# Patient Record
Sex: Female | Born: 1995 | Hispanic: Yes | Marital: Single | Smoking: Never smoker
Health system: Southern US, Community
[De-identification: ages and names within clinical notes are randomized; demographics above are authoritative.]

## PROBLEM LIST (undated history)

## (undated) ENCOUNTER — Inpatient Hospital Stay (HOSPITAL_COMMUNITY): Payer: Self-pay

## (undated) DIAGNOSIS — F32A Depression, unspecified: Secondary | ICD-10-CM

## (undated) DIAGNOSIS — D649 Anemia, unspecified: Secondary | ICD-10-CM

## (undated) DIAGNOSIS — F329 Major depressive disorder, single episode, unspecified: Secondary | ICD-10-CM

## (undated) DIAGNOSIS — B999 Unspecified infectious disease: Secondary | ICD-10-CM

## (undated) HISTORY — PX: FASCIOTOMY: SHX132

## (undated) HISTORY — PX: CHOLECYSTECTOMY: SHX55

---
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
## 2015-04-24 ENCOUNTER — Emergency Department (HOSPITAL_COMMUNITY)
Admission: EM | Admit: 2015-04-24 | Discharge: 2015-04-24 | Disposition: A | Discharge status: Home / Self Care | Payer: BLUE CROSS/BLUE SHIELD | Attending: Emergency Medicine | Admitting: Emergency Medicine

## 2015-04-24 ENCOUNTER — Emergency Department (HOSPITAL_COMMUNITY): Payer: BLUE CROSS/BLUE SHIELD

## 2015-04-24 ENCOUNTER — Encounter (HOSPITAL_COMMUNITY): Payer: Self-pay | Admitting: Emergency Medicine

## 2015-04-24 DIAGNOSIS — N83202 Unspecified ovarian cyst, left side: Secondary | ICD-10-CM

## 2015-04-24 DIAGNOSIS — Z8659 Personal history of other mental and behavioral disorders: Secondary | ICD-10-CM | POA: Diagnosis not present

## 2015-04-24 DIAGNOSIS — D649 Anemia, unspecified: Secondary | ICD-10-CM | POA: Diagnosis not present

## 2015-04-24 DIAGNOSIS — Z79899 Other long term (current) drug therapy: Secondary | ICD-10-CM | POA: Insufficient documentation

## 2015-04-24 DIAGNOSIS — R1030 Lower abdominal pain, unspecified: Secondary | ICD-10-CM | POA: Diagnosis present

## 2015-04-24 DIAGNOSIS — Z87440 Personal history of urinary (tract) infections: Secondary | ICD-10-CM | POA: Diagnosis not present

## 2015-04-24 DIAGNOSIS — Z91040 Latex allergy status: Secondary | ICD-10-CM | POA: Insufficient documentation

## 2015-04-24 DIAGNOSIS — R109 Unspecified abdominal pain: Secondary | ICD-10-CM

## 2015-04-24 LAB — CBC WITH DIFFERENTIAL/PLATELET
Basophils Absolute: 0 10*3/uL (ref 0.0–0.1)
Basophils Relative: 0 %
Eosinophils Absolute: 0.1 10*3/uL (ref 0.0–0.7)
Eosinophils Relative: 1 %
HCT: 39.6 % (ref 36.0–46.0)
Hemoglobin: 13.4 g/dL (ref 12.0–15.0)
Lymphocytes Relative: 25 %
Lymphs Abs: 2.4 10*3/uL (ref 0.7–4.0)
MCH: 29.5 pg (ref 26.0–34.0)
MCHC: 33.8 g/dL (ref 30.0–36.0)
MCV: 87 fL (ref 78.0–100.0)
Monocytes Absolute: 0.6 10*3/uL (ref 0.1–1.0)
Monocytes Relative: 6 %
Neutro Abs: 6.6 10*3/uL (ref 1.7–7.7)
Neutrophils Relative %: 68 %
Platelets: 205 10*3/uL (ref 150–400)
RBC: 4.55 MIL/uL (ref 3.87–5.11)
RDW: 13.1 % (ref 11.5–15.5)
WBC: 9.7 10*3/uL (ref 4.0–10.5)

## 2015-04-24 LAB — COMPREHENSIVE METABOLIC PANEL
ALT: 35 U/L (ref 14–54)
AST: 26 U/L (ref 15–41)
Albumin: 3.8 g/dL (ref 3.5–5.0)
Alkaline Phosphatase: 92 U/L (ref 38–126)
Anion gap: 8 (ref 5–15)
BUN: 18 mg/dL (ref 6–20)
CO2: 23 mmol/L (ref 22–32)
Calcium: 10.1 mg/dL (ref 8.9–10.3)
Chloride: 105 mmol/L (ref 101–111)
Creatinine, Ser: 0.57 mg/dL (ref 0.44–1.00)
GFR calc Af Amer: >60 mL/min (ref >60)
GFR calc non Af Amer: >60 mL/min (ref >60)
Glucose, Bld: 97 mg/dL (ref 65–99)
Potassium: 4 mmol/L (ref 3.5–5.1)
Sodium: 136 mmol/L (ref 135–145)
Total Bilirubin: 0.5 mg/dL (ref 0.3–1.2)
Total Protein: 8 g/dL (ref 6.5–8.1)

## 2015-04-24 LAB — URINALYSIS, ROUTINE W REFLEX MICROSCOPIC
Bilirubin Urine: NEGATIVE
Glucose, UA: NEGATIVE mg/dL
Hgb urine dipstick: NEGATIVE
Ketones, ur: NEGATIVE mg/dL
Leukocytes, UA: NEGATIVE
Nitrite: NEGATIVE
Protein, ur: NEGATIVE mg/dL
Specific Gravity, Urine: 1.018 (ref 1.005–1.030)
pH: 6.5 (ref 5.0–8.0)

## 2015-04-24 LAB — WET PREP, GENITAL
Clue Cells Wet Prep HPF POC: NONE SEEN
Sperm: NONE SEEN
Trich, Wet Prep: NONE SEEN
Yeast Wet Prep HPF POC: NONE SEEN

## 2015-04-24 LAB — I-STAT BETA HCG BLOOD, ED (MC, WL, AP ONLY): I-stat hCG, quantitative: <5 m[IU]/mL (ref <5)

## 2015-04-24 NOTE — ED Provider Notes (Signed)
CSN: [id number]     Arrival date & time 04/24/15  0720 History   First MD Initiated Contact with Patient 04/24/15 [phone]     Chief Complaint  Patient presents with  . Abdominal Pain     (Consider location/radiation/quality/duration/timing/severity/associated sxs/prior Treatment) HPI   89 y f w PMH previous c-section coming in with lower abdominal pain that started suddenly this morning at 5am.  The abdominal pain is equal bilaterally.  The abdominal pain is sharp with no associated sx.  The abdominal pain was maximal intensity at onset but has been improving.  She has never had abdominal pain like this before.  Past Medical History  Diagnosis Date  . Anemia   . Infection     UTI  . Depression     doing ok now   Past Surgical History  Procedure Laterality Date  . Fasciotomy      bilateral  . Cesarean section N/A 11/28/2014    Procedure: CESAREAN SECTION;  Surgeon: Myna Hidalgo, DO;  Location: [facility];  Service: Obstetrics;  Laterality: N/A;   Family History  Problem Relation Age of Onset  . Alcohol abuse Neg Hx   . Arthritis Neg Hx   . Asthma Neg Hx   . Birth defects Neg Hx   . Cancer Neg Hx   . COPD Neg Hx   . Depression Neg Hx   . Drug abuse Neg Hx   . Early death Neg Hx   . Hearing loss Neg Hx   . Heart disease Neg Hx   . Hyperlipidemia Neg Hx   . Hypertension Neg Hx   . Kidney disease Neg Hx   . Learning disabilities Neg Hx   . Mental illness Neg Hx   . Mental retardation Neg Hx   . Miscarriages / Stillbirths Neg Hx   . Stroke Neg Hx   . Vision loss Neg Hx   . Varicose Veins Neg Hx   . Diabetes Paternal Grandmother   . Diabetes Paternal Grandfather    Social History  Substance Use Topics  . Smoking status: Never Smoker   . Smokeless tobacco: Never Used  . Alcohol Use: No   OB History    Gravida Para Term Preterm AB TAB SAB Ectopic Multiple Living   0 1     Review of Systems  Constitutional: Negative for fever and chills.  HENT: Negative  for nosebleeds.   Eyes: Negative for visual disturbance.  Respiratory: Negative for cough and shortness of breath.   Cardiovascular: Negative for chest pain.  Gastrointestinal: Positive for abdominal pain. Negative for nausea, vomiting, diarrhea and constipation.  Genitourinary: Negative for dysuria.  Skin: Negative for rash.  Neurological: Negative for weakness.  All other systems reviewed and are negative.     Allergies  Latex  Home Medications   Prior to Admission medications   Medication Sig Start Date End Date Taking? Authorizing Provider  ferrous sulfate 325 (65 FE) MG tablet Take 1 tablet (325 mg total) by mouth daily with breakfast. 12/01/14  Yes Nigel Bridgeman, CNM  ibuprofen (ADVIL,MOTRIN) 600 MG tablet Take 1 tablet (600 mg total) by mouth every 6 (six) hours as needed. 12/01/14  Yes Nigel Bridgeman, CNM  Prenatal Vit-Fe Fumarate-FA (PRENATAL MULTIVITAMIN) TABS tablet Take 1 tablet by mouth daily.    Yes Historical Provider, MD  acetaminophen (TYLENOL) 500 MG tablet Take 1,000 mg by mouth every 6 (six) hours as needed for mild pain.  Historical Provider, MD  oxyCODONE-acetaminophen (PERCOCET/ROXICET) 5-325 MG per tablet Take 1 tablet by mouth every 4 (four) hours as needed (for pain scale 4-7). 12/01/14   Nigel Bridgeman, CNM   BP 101/46 mmHg  Pulse 63  Temp(Src) 98.5 F (36.9 C) (Oral)  Resp 17  SpO2 99% Physical Exam  Constitutional: She is oriented to person, place, and time. No distress.  HENT:  Head: Normocephalic and atraumatic.  Eyes: EOM are normal. Pupils are equal, round, and reactive to light.  Neck: Normal range of motion. Neck supple.  Cardiovascular: Normal rate and intact distal pulses.   Pulmonary/Chest: No respiratory distress.  Abdominal: Soft. There is tenderness (bilateral lower quadrant ttp).  Musculoskeletal: Normal range of motion.  Neurological: She is alert and oriented to person, place, and time.  Skin: No rash noted. She is not diaphoretic.   Psychiatric: She has a normal mood and affect.    ED Course  Procedures (including critical care time) Labs Review Labs Reviewed  WET PREP, GENITAL - Abnormal; Notable for the following:    WBC, Wet Prep HPF POC MODERATE (*)    All other components within normal limits  URINE CULTURE  CBC WITH DIFFERENTIAL/PLATELET  COMPREHENSIVE METABOLIC PANEL  URINALYSIS, ROUTINE W REFLEX MICROSCOPIC (NOT AT [facility])  I-STAT BETA HCG BLOOD, ED ([facility], [facility], [facility] ONLY)  GC/CHLAMYDIA PROBE AMP ([place]) NOT AT [facility]  WET PREP  (BD AFFIRM) ([employer])    Imaging Review US Transvaginal Non-ob  04/24/2015  CLINICAL DATA:  Acute pelvic pain.  Last menstrual period 03/12/2015 EXAM: TRANSABDOMINAL AND TRANSVAGINAL ULTRASOUND OF PELVIS DOPPLER ULTRASOUND OF OVARIES TECHNIQUE: Both transabdominal and transvaginal ultrasound examinations of the pelvis were performed. Transabdominal technique was performed for global imaging of the pelvis including uterus, ovaries, adnexal regions, and pelvic cul-de-sac. It was necessary to proceed with endovaginal exam following the transabdominal exam to visualize the IUD. Color and duplex Doppler ultrasound was utilized to evaluate blood flow to the ovaries. COMPARISON:  None. FINDINGS: Uterus Measurements: 8.3 x 4.4 x 5.7 cm. No fibroids or other mass visualized. IUD is in place, in satisfactory position sonographically. Endometrium Thickness: 12.5 mm.  No focal abnormality visualized. Right ovary Measurements: 2.3 x 2.4 x 1.8 cm. Normal appearance/no adnexal mass. Left ovary Measurements: 3.5 x 1.6 x 1.7 cm. There is a 1.6 x 1.0 x 0.8 cm hyperechoic circumscribed structure associated with the left ovary. Pulsed Doppler evaluation of both ovaries demonstrates normal low-resistance arterial and venous waveforms. Other findings Small amount of free fluid IMPRESSION: Mild thickening of the endometrium, somewhat unusual finding in the presence of IUD. 1.6 cm hypoechoic structure associated  with the left ovary, which may represent degenerating hemorrhagic cyst or corpus luteum. Normal appearance of the right ovary. Small amount of free fluid in the pelvis. Electronically Signed   By: Ted Mcalpine M.D.   On: 04/24/2015 10:12   US Pelvis Complete  04/24/2015  CLINICAL DATA:  Acute pelvic pain.  Last menstrual period 03/12/2015 EXAM: TRANSABDOMINAL AND TRANSVAGINAL ULTRASOUND OF PELVIS DOPPLER ULTRASOUND OF OVARIES TECHNIQUE: Both transabdominal and transvaginal ultrasound examinations of the pelvis were performed. Transabdominal technique was performed for global imaging of the pelvis including uterus, ovaries, adnexal regions, and pelvic cul-de-sac. It was necessary to proceed with endovaginal exam following the transabdominal exam to visualize the IUD. Color and duplex Doppler ultrasound was utilized to evaluate blood flow to the ovaries. COMPARISON:  None. FINDINGS: Uterus Measurements: 8.3 x 4.4 x 5.7 cm. No fibroids or other  mass visualized. IUD is in place, in satisfactory position sonographically. Endometrium Thickness: 12.5 mm.  No focal abnormality visualized. Right ovary Measurements: 2.3 x 2.4 x 1.8 cm. Normal appearance/no adnexal mass. Left ovary Measurements: 3.5 x 1.6 x 1.7 cm. There is a 1.6 x 1.0 x 0.8 cm hyperechoic circumscribed structure associated with the left ovary. Pulsed Doppler evaluation of both ovaries demonstrates normal low-resistance arterial and venous waveforms. Other findings Small amount of free fluid IMPRESSION: Mild thickening of the endometrium, somewhat unusual finding in the presence of IUD. 1.6 cm hypoechoic structure associated with the left ovary, which may represent degenerating hemorrhagic cyst or corpus luteum. Normal appearance of the right ovary. Small amount of free fluid in the pelvis. Electronically Signed   By: Ted Mcalpine M.D.   On: 04/24/2015 10:12   [place] Art/ven Flow Abd Pelv Doppler  04/24/2015  CLINICAL DATA:  Acute pelvic pain.   Last menstrual period 03/12/2015 EXAM: TRANSABDOMINAL AND TRANSVAGINAL ULTRASOUND OF PELVIS DOPPLER ULTRASOUND OF OVARIES TECHNIQUE: Both transabdominal and transvaginal ultrasound examinations of the pelvis were performed. Transabdominal technique was performed for global imaging of the pelvis including uterus, ovaries, adnexal regions, and pelvic cul-de-sac. It was necessary to proceed with endovaginal exam following the transabdominal exam to visualize the IUD. Color and duplex Doppler ultrasound was utilized to evaluate blood flow to the ovaries. COMPARISON:  None. FINDINGS: Uterus Measurements: 8.3 x 4.4 x 5.7 cm. No fibroids or other mass visualized. IUD is in place, in satisfactory position sonographically. Endometrium Thickness: 12.5 mm.  No focal abnormality visualized. Right ovary Measurements: 2.3 x 2.4 x 1.8 cm. Normal appearance/no adnexal mass. Left ovary Measurements: 3.5 x 1.6 x 1.7 cm. There is a 1.6 x 1.0 x 0.8 cm hyperechoic circumscribed structure associated with the left ovary. Pulsed Doppler evaluation of both ovaries demonstrates normal low-resistance arterial and venous waveforms. Other findings Small amount of free fluid IMPRESSION: Mild thickening of the endometrium, somewhat unusual finding in the presence of IUD. 1.6 cm hypoechoic structure associated with the left ovary, which may represent degenerating hemorrhagic cyst or corpus luteum. Normal appearance of the right ovary. Small amount of free fluid in the pelvis. Electronically Signed   By: Ted Mcalpine M.D.   On: 04/24/2015 10:12   I have personally reviewed and evaluated these images and lab results as part of my medical decision-making.   EKG Interpretation None      MDM   Final diagnoses:  Cyst of left ovary  Abdominal pain, unspecified abdominal location     18 y f w PMH previous c-section coming in with lower abdominal pain that started suddenly this morning at 5am.   Exam as above.    ttp in the LLQ  and RLQ, no guarding. Ddx:  Ovarian cyst, torsion.  Doubt appy given sudden onset pain and non-localizing exam.  Doubt sbo.  Feel torsion is also less likely given non-localizing exam.  Will obtain cbc/bmp/ua/preg/pelvic US  Cbc/bmp unremarkable.  preg neg.  ua neg.  Wet prep/gc/chl obtained and wet prep neg w/ blind swab, pelvic declined.  Pelvis US with ovarian cyst on L w/o evidence of torsion.   Likely hemorrhagic cyst is the etiology of her pain, her pain has continued to improve, feel safe for d/c.    Silas Flood, MD 04/24/15 1205  Marily Memos, MD 04/24/15 1620

## 2015-04-24 NOTE — ED Notes (Signed)
Per Resident pt to swab self & RN is to facilitate collection of samples to be sent to lab, pt swabbed self & samples collected

## 2015-04-24 NOTE — ED Notes (Signed)
Pelvic cart at bedside. 

## 2015-04-24 NOTE — Discharge Instructions (Signed)

## 2015-04-24 NOTE — ED Notes (Signed)
Pt arrives with lower abdominal pain since 5am. LBM yesterday was normal. Denies nausea/vomting/fever/dysuria. Pt resting in bed, VSS.

## 2015-04-25 LAB — URINE CULTURE

## 2015-04-27 LAB — GC/CHLAMYDIA PROBE AMP (~~LOC~~) NOT AT ARMC
Chlamydia: NEGATIVE
Neisseria Gonorrhea: NEGATIVE

## 2015-08-31 DIAGNOSIS — J020 Streptococcal pharyngitis: Secondary | ICD-10-CM | POA: Diagnosis not present

## 2015-09-17 DIAGNOSIS — J029 Acute pharyngitis, unspecified: Secondary | ICD-10-CM | POA: Diagnosis not present

## 2015-10-26 DIAGNOSIS — E559 Vitamin D deficiency, unspecified: Secondary | ICD-10-CM | POA: Diagnosis not present

## 2015-10-26 DIAGNOSIS — F418 Other specified anxiety disorders: Secondary | ICD-10-CM | POA: Diagnosis not present

## 2015-11-23 DIAGNOSIS — F418 Other specified anxiety disorders: Secondary | ICD-10-CM | POA: Diagnosis not present

## 2015-11-23 DIAGNOSIS — Z23 Encounter for immunization: Secondary | ICD-10-CM | POA: Diagnosis not present

## 2016-01-04 DIAGNOSIS — F418 Other specified anxiety disorders: Secondary | ICD-10-CM | POA: Diagnosis not present

## 2016-02-08 DIAGNOSIS — J069 Acute upper respiratory infection, unspecified: Secondary | ICD-10-CM | POA: Diagnosis not present

## 2016-03-23 DIAGNOSIS — J029 Acute pharyngitis, unspecified: Secondary | ICD-10-CM | POA: Diagnosis not present

## 2016-03-23 DIAGNOSIS — R6889 Other general symptoms and signs: Secondary | ICD-10-CM | POA: Diagnosis not present

## 2016-03-25 DIAGNOSIS — J029 Acute pharyngitis, unspecified: Secondary | ICD-10-CM | POA: Diagnosis not present

## 2016-03-25 DIAGNOSIS — R509 Fever, unspecified: Secondary | ICD-10-CM | POA: Diagnosis not present

## 2016-03-25 DIAGNOSIS — R300 Dysuria: Secondary | ICD-10-CM | POA: Diagnosis not present

## 2016-03-25 DIAGNOSIS — R197 Diarrhea, unspecified: Secondary | ICD-10-CM | POA: Diagnosis not present

## 2016-05-03 DIAGNOSIS — N898 Other specified noninflammatory disorders of vagina: Secondary | ICD-10-CM | POA: Diagnosis not present

## 2016-05-11 DIAGNOSIS — S336XXA Sprain of sacroiliac joint, initial encounter: Secondary | ICD-10-CM | POA: Diagnosis not present

## 2016-05-11 DIAGNOSIS — S29012A Strain of muscle and tendon of back wall of thorax, initial encounter: Secondary | ICD-10-CM | POA: Diagnosis not present

## 2016-05-11 DIAGNOSIS — M546 Pain in thoracic spine: Secondary | ICD-10-CM | POA: Diagnosis not present

## 2016-05-16 DIAGNOSIS — J3489 Other specified disorders of nose and nasal sinuses: Secondary | ICD-10-CM | POA: Diagnosis not present

## 2016-05-16 DIAGNOSIS — R05 Cough: Secondary | ICD-10-CM | POA: Diagnosis not present

## 2016-05-16 DIAGNOSIS — R509 Fever, unspecified: Secondary | ICD-10-CM | POA: Diagnosis not present

## 2016-05-16 DIAGNOSIS — J329 Chronic sinusitis, unspecified: Secondary | ICD-10-CM | POA: Diagnosis not present

## 2016-06-02 DIAGNOSIS — Z975 Presence of (intrauterine) contraceptive device: Secondary | ICD-10-CM | POA: Diagnosis not present

## 2016-06-02 DIAGNOSIS — N926 Irregular menstruation, unspecified: Secondary | ICD-10-CM | POA: Diagnosis not present

## 2016-06-02 DIAGNOSIS — Z3202 Encounter for pregnancy test, result negative: Secondary | ICD-10-CM | POA: Diagnosis not present

## 2016-06-02 DIAGNOSIS — N898 Other specified noninflammatory disorders of vagina: Secondary | ICD-10-CM | POA: Diagnosis not present

## 2016-07-12 DIAGNOSIS — Z30431 Encounter for routine checking of intrauterine contraceptive device: Secondary | ICD-10-CM | POA: Diagnosis not present

## 2016-07-12 DIAGNOSIS — N898 Other specified noninflammatory disorders of vagina: Secondary | ICD-10-CM | POA: Diagnosis not present

## 2017-03-15 DIAGNOSIS — N898 Other specified noninflammatory disorders of vagina: Secondary | ICD-10-CM | POA: Diagnosis not present

## 2017-03-15 DIAGNOSIS — R300 Dysuria: Secondary | ICD-10-CM | POA: Diagnosis not present

## 2017-05-03 DIAGNOSIS — K30 Functional dyspepsia: Secondary | ICD-10-CM | POA: Diagnosis not present

## 2017-05-03 DIAGNOSIS — K5900 Constipation, unspecified: Secondary | ICD-10-CM | POA: Diagnosis not present

## 2017-05-03 DIAGNOSIS — R197 Diarrhea, unspecified: Secondary | ICD-10-CM | POA: Diagnosis not present

## 2017-05-03 DIAGNOSIS — N898 Other specified noninflammatory disorders of vagina: Secondary | ICD-10-CM | POA: Diagnosis not present

## 2017-05-03 DIAGNOSIS — M545 Low back pain: Secondary | ICD-10-CM | POA: Diagnosis not present

## 2017-06-02 DIAGNOSIS — R102 Pelvic and perineal pain: Secondary | ICD-10-CM | POA: Diagnosis not present

## 2017-06-02 DIAGNOSIS — N760 Acute vaginitis: Secondary | ICD-10-CM | POA: Diagnosis not present

## 2017-06-02 DIAGNOSIS — Z975 Presence of (intrauterine) contraceptive device: Secondary | ICD-10-CM | POA: Diagnosis not present

## 2017-07-06 ENCOUNTER — Encounter: Payer: Self-pay | Admitting: Family Medicine

## 2017-07-06 ENCOUNTER — Ambulatory Visit: Payer: Self-pay | Admitting: Family Medicine

## 2017-07-06 VITALS — BP 120/80 | HR 107 | Temp 98.5°F | Wt 134.0 lb

## 2017-07-06 DIAGNOSIS — J111 Influenza due to unidentified influenza virus with other respiratory manifestations: Secondary | ICD-10-CM

## 2017-07-06 DIAGNOSIS — R69 Illness, unspecified: Secondary | ICD-10-CM

## 2017-07-06 MED ORDER — PSEUDOEPH-BROMPHEN-DM 30-2-10 MG/5ML PO SYRP: 5.0000 mL | ORAL_SOLUTION | Freq: Four times a day (QID) | ORAL | 0 refills | Status: DC | PRN

## 2017-07-06 MED ORDER — OSELTAMIVIR PHOSPHATE 75 MG PO CAPS: 75.0000 mg | ORAL_CAPSULE | Freq: Two times a day (BID) | ORAL | 0 refills | Status: DC

## 2017-07-06 MED ORDER — PREDNISONE 20 MG PO TABS: 40.0000 mg | ORAL_TABLET | Freq: Every day | ORAL | 0 refills | Status: AC

## 2017-07-06 NOTE — Patient Instructions (Signed)
Take all medications as directed.  Hydrate consistently with water with at least 6-8 glasses per day.   Influenza, Adult Influenza ("the flu") is an infection in the lungs, nose, and throat (respiratory tract). It is caused by a virus. The flu causes many common cold symptoms, as well as a high fever and body aches. It can make you feel very sick. The flu spreads easily from person to person (is contagious). Getting a flu shot (influenza vaccination) every year is the best way to prevent the flu. Follow these instructions at home:  Take over-the-counter and prescription medicines only as told by your doctor.  Use a cool mist humidifier to add moisture (humidity) to the air in your home. This can make it easier to breathe.  Rest as needed.  Drink enough fluid to keep your pee (urine) clear or pale yellow.  Cover your mouth and nose when you cough or sneeze.  Wash your hands with soap and water often, especially after you cough or sneeze. If you cannot use soap and water, use hand sanitizer.  Stay home from work or school as told by your doctor. Unless you are visiting your doctor, try to avoid leaving home until your fever has been gone for 24 hours without the use of medicine.  Keep all follow-up visits as told by your doctor. This is important. How is this prevented?  Getting a yearly (annual) flu shot is the best way to avoid getting the flu. You may get the flu shot in late summer, fall, or winter. Ask your doctor when you should get your flu shot.  Wash your hands often or use hand sanitizer often.  Avoid contact with people who are sick during cold and flu season.  Eat healthy foods.  Drink plenty of fluids.  Get enough sleep.  Exercise regularly. Contact a doctor if:  You get new symptoms.  You have: ? Chest pain. ? Watery poop (diarrhea). ? A fever.  Your cough gets worse.  You start to have more mucus.  You feel sick to your stomach (nauseous).  You throw  up (vomit). Get help right away if:  You start to be short of breath or have trouble breathing.  Your skin or nails turn a bluish color.  You have very bad pain or stiffness in your neck.  You get a sudden headache.  You get sudden pain in your face or ear.  You cannot stop throwing up. This information is not intended to replace advice given to you by your health care provider. Make sure you discuss any questions you have with your health care provider. Document Released: 12/22/2007 Document Revised: 08/20/2015 Document Reviewed: 01/06/2015 Elsevier Interactive Patient Education  2017 [employer]

## 2017-07-06 NOTE — Progress Notes (Signed)
Patient ID: [place] LeitzMariana Wallace, female    DOB: 1996-03-14, 22 y.o.   MRN: [id number]  PCP: No PCP   Chief Complaint  Patient presents with  . Fever  . Cough    Flu like symptoms    Subjective:  HPI [place] LeitzMariana Wallace is a 22 y.o. female who complains of dry cough, sinus congestion, headache, itching/ swelling of  eyes, fevers up to 102 degrees for one day, aching, and fatigue. She has received her influenza vaccine this year. No known exposure to influenza. 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  She has taken benadryl and acetaminophen around the clock with last dose taken yesterday evening. She denies a history of asthma. Patient does not smoke cigarettes. Social History   Socioeconomic History  . Marital status: Single    Spouse name: Not on file  . Number of children: Not on file  . Years of education: Not on file  . Highest education level: Not on file  Occupational History  . Not on file  Social Needs  . Financial resource strain: Not on file  . Food insecurity:    Worry: Not on file    Inability: Not on file  . Transportation needs:    Medical: Not on file    Non-medical: Not on file  Tobacco Use  . Smoking status: Never Smoker  . Smokeless tobacco: Never Used  Substance and Sexual Activity  . Alcohol use: No  . Drug use: No  . Sexual activity: Yes    Birth control/protection: None  Lifestyle  . Physical activity:    Days per week: Not on file    Minutes per session: Not on file  . Stress: Not on file  Relationships  . Social connections:    Talks on phone: Not on file    Gets together: Not on file    Attends religious service: Not on file    Active member of club or organization: Not on file    Attends meetings of clubs or organizations: Not on file    Relationship status: Not on file  . Intimate partner violence:    Fear of current or ex partner: Not on file    Emotionally abused: Not on file    Physically abused: Not on file    Forced sexual  activity: Not on file  Other Topics Concern  . Not on file  Social History Narrative  . Not on file    Family History  Problem Relation Age of Onset  . Alcohol abuse Neg Hx   . Arthritis Neg Hx   . Asthma Neg Hx   . Birth defects Neg Hx   . Cancer Neg Hx   . COPD Neg Hx   . Depression Neg Hx   . Drug abuse Neg Hx   . Early death Neg Hx   . Hearing loss Neg Hx   . Heart disease Neg Hx   . Hyperlipidemia Neg Hx   . Hypertension Neg Hx   . Kidney disease Neg Hx   . Learning disabilities Neg Hx   . Mental illness Neg Hx   . Mental retardation Neg Hx   . Miscarriages / Stillbirths Neg Hx   . Stroke Neg Hx   . Vision loss Neg Hx   . Varicose Veins Neg Hx   . Diabetes Paternal Grandmother   . Diabetes Paternal Grandfather    Review of Systems  Pertinent negatives listed in HPI   Patient Active Problem  List   Diagnosis Date Noted  . Status post primary low transverse cesarean section--FTD 12/01/2014    Allergies  Allergen Reactions  . Latex Hives    Prior to Admission medications   Medication Sig Start Date End Date Taking? Authorizing Provider  acetaminophen (TYLENOL) 500 MG tablet Take 1,000 mg by mouth every 6 (six) hours as needed for mild pain.   Yes [provider]  ferrous sulfate 325 (65 FE) MG tablet Take 1 tablet (325 mg total) by mouth daily with breakfast. Patient not taking: Reported on 07/06/2017 12/01/14   Nigel Bridgeman, CNM  ibuprofen (ADVIL,MOTRIN) 600 MG tablet Take 1 tablet (600 mg total) by mouth every 6 (six) hours as needed. Patient not taking: Reported on 07/06/2017 12/01/14   Nigel Bridgeman, CNM  oxyCODONE-acetaminophen (PERCOCET/ROXICET) 5-325 MG per tablet Take 1 tablet by mouth every 4 (four) hours as needed (for pain scale 4-7). Patient not taking: Reported on 07/06/2017 12/01/14   Nigel Bridgeman, CNM  Prenatal Vit-Fe Fumarate-FA (PRENATAL MULTIVITAMIN) TABS tablet Take 1 tablet by mouth daily.     [provider]    Past Medical,  Surgical Family and Social History reviewed and updated.    Objective:   Today's Vitals   07/06/17 1219  BP: 120/80  Pulse: (!) 107  Temp: 98.5 F (36.9 C)  SpO2: 98%  Weight: 134 lb (60.8 kg)    Wt Readings from Last 3 Encounters:  07/06/17 134 lb (60.8 kg)  11/27/14 153 lb (69.4 kg) (84 %, Z= 1.00)*  11/25/14 155 lb (70.3 kg) (85 %, Z= 1.06)*   * Growth percentiles are based on CDC (Girls, 2-20 Years) data.   Physical Exam  Constitutional: She appears well-developed and well-nourished.  HENT:  Head: Normocephalic and atraumatic.  Cardiovascular: Tachycardia present.  No murmur heard. Pulmonary/Chest: Effort normal and breath sounds normal.  Neurological: She is alert.  Skin: Skin is warm and dry.  Psychiatric: She has a normal mood and affect. Her behavior is normal. Judgment and thought content normal.    Assessment & Plan:   1. Influenza-like illness, suspect influenza virus. Symptoms present for less than 48 hours therefore will start Tami-flu today. For symptoms of eye swelling, which I suspect is related to environmental allergies will trial a short course of prednisone.   Meds ordered this encounter  Medications  . oseltamivir (TAMIFLU) 75 MG capsule    Sig: Take 1 capsule (75 mg total) by mouth 2 (two) times daily.    Dispense:  10 capsule    Refill:  0  . predniSONE (DELTASONE) 20 MG tablet    Sig: Take 2 tablets (40 mg total) by mouth daily with breakfast for 5 days.    Dispense:  10 tablet    Refill:  0  . brompheniramine-pseudoephedrine-DM 30-2-10 MG/5ML syrup    Sig: Take 5 mLs by mouth 4 (four) times daily as needed.    Dispense:  240 mL    Refill:  0     If symptoms worsen or do not improve, return for follow-up, follow-up with PCP, or at the emergency department if severity of symptoms warrant a higher level of care.   Godfrey Pick. Tiburcio Pea, MSN, FNP-C [address]  [place], [place] [address] [phone]

## 2017-07-11 ENCOUNTER — Telehealth: Payer: Self-pay | Admitting: Emergency Medicine

## 2017-07-11 NOTE — Telephone Encounter (Signed)
Left message following up on visit with [facility] 

## 2017-08-30 DIAGNOSIS — R102 Pelvic and perineal pain: Secondary | ICD-10-CM | POA: Diagnosis not present

## 2017-08-30 DIAGNOSIS — Z01411 Encounter for gynecological examination (general) (routine) with abnormal findings: Secondary | ICD-10-CM | POA: Diagnosis not present

## 2017-08-30 DIAGNOSIS — Z30431 Encounter for routine checking of intrauterine contraceptive device: Secondary | ICD-10-CM | POA: Diagnosis not present

## 2017-08-30 DIAGNOSIS — N760 Acute vaginitis: Secondary | ICD-10-CM | POA: Diagnosis not present

## 2017-09-07 DIAGNOSIS — R102 Pelvic and perineal pain: Secondary | ICD-10-CM | POA: Diagnosis not present

## 2017-09-07 DIAGNOSIS — Z30431 Encounter for routine checking of intrauterine contraceptive device: Secondary | ICD-10-CM | POA: Diagnosis not present

## 2017-09-22 ENCOUNTER — Encounter: Payer: Self-pay | Admitting: Family Medicine

## 2017-09-22 ENCOUNTER — Ambulatory Visit (INDEPENDENT_AMBULATORY_CARE_PROVIDER_SITE_OTHER): Payer: Self-pay | Admitting: Family Medicine

## 2017-09-22 VITALS — BP 90/60 | HR 68 | Temp 98.6°F | Ht 63.0 in | Wt 136.0 lb

## 2017-09-22 DIAGNOSIS — N3001 Acute cystitis with hematuria: Secondary | ICD-10-CM

## 2017-09-22 DIAGNOSIS — Z0000 Encounter for general adult medical examination without abnormal findings: Secondary | ICD-10-CM

## 2017-09-22 LAB — POCT URINALYSIS DIPSTICK
Bilirubin, UA: NEGATIVE
Glucose, UA: NEGATIVE
Ketones, UA: NEGATIVE
Nitrite, UA: POSITIVE
Protein, UA: NEGATIVE
Spec Grav, UA: 1.025 (ref 1.010–1.025)
Urobilinogen, UA: 0.2 E.U./dL
pH, UA: 7 (ref 5.0–8.0)

## 2017-09-22 LAB — POCT URINE PREGNANCY: Preg Test, Ur: NEGATIVE

## 2017-09-22 MED ORDER — DOXYCYCLINE HYCLATE 100 MG PO CAPS: 100.0000 mg | ORAL_CAPSULE | Freq: Two times a day (BID) | ORAL | 0 refills | Status: DC

## 2017-09-22 NOTE — Patient Instructions (Signed)
Health Maintenance, Female Adopting a healthy lifestyle and getting preventive care can go a long way to promote health and wellness. Talk with your health care provider about what schedule of regular examinations is right for you. This is a good chance for you to check in with your provider about disease prevention and staying healthy. In between checkups, there are plenty of things you can do on your own. Experts have done a lot of research about which lifestyle changes and preventive measures are most likely to keep you healthy. Ask your health care provider for more information. Weight and diet Eat a healthy diet  Be sure to include plenty of vegetables, fruits, low-fat dairy products, and lean protein.  Do not eat a lot of foods high in solid fats, added sugars, or salt.  Get regular exercise. This is one of the most important things you can do for your health. ? Most adults should exercise for at least 150 minutes each week. The exercise should increase your heart rate and make you sweat (moderate-intensity exercise). ? Most adults should also do strengthening exercises at least twice a week. This is in addition to the moderate-intensity exercise.  Maintain a healthy weight  Body mass index (BMI) is a measurement that can be used to identify possible weight problems. It estimates body fat based on height and weight. Your health care provider can help determine your BMI and help you achieve or maintain a healthy weight.  For females 69 years of age and older: ? A BMI below 18.5 is considered underweight. ? A BMI of 18.5 to 24.9 is normal. ? A BMI of 25 to 29.9 is considered overweight. ? A BMI of 30 and above is considered obese.  Watch levels of cholesterol and blood lipids  You should start having your blood tested for lipids and cholesterol at 22 years of age, then have this test every 5 years.  You may need to have your cholesterol levels checked more often if: ? Your lipid or  cholesterol levels are high. ? You are older than 22 years of age. ? You are at high risk for heart disease.  Cancer screening Lung Cancer  Lung cancer screening is recommended for adults 70-27 years old who are at high risk for lung cancer because of a history of smoking.  A yearly low-dose CT scan of the lungs is recommended for people who: ? Currently smoke. ? Have quit within the past 15 years. ? Have at least a 30-pack-year history of smoking. A pack year is smoking an average of one pack of cigarettes a day for 1 year.  Yearly screening should continue until it has been 15 years since you quit.  Yearly screening should stop if you develop a health problem that would prevent you from having lung cancer treatment.  Breast Cancer  Practice breast self-awareness. This means understanding how your breasts normally appear and feel.  It also means doing regular breast self-exams. Let your health care provider know about any changes, no matter how small.  If you are in your 20s or 30s, you should have a clinical breast exam (CBE) by a health care provider every 1-3 years as part of a regular health exam.  If you are 68 or older, have a CBE every year. Also consider having a breast X-ray (mammogram) every year.  If you have a family history of breast cancer, talk to your health care provider about genetic screening.  If you are at high risk  for breast cancer, talk to your health care provider about having an MRI and a mammogram every year.  Breast cancer gene (BRCA) assessment is recommended for women who have family members with BRCA-related cancers. BRCA-related cancers include: ? Breast. ? Ovarian. ? Tubal. ? Peritoneal cancers.  Results of the assessment will determine the need for genetic counseling and BRCA1 and BRCA2 testing.  Cervical Cancer Your health care provider may recommend that you be screened regularly for cancer of the pelvic organs (ovaries, uterus, and  vagina). This screening involves a pelvic examination, including checking for microscopic changes to the surface of your cervix (Pap test). You may be encouraged to have this screening done every 3 years, beginning at age 22.  For women ages 56-65, health care providers may recommend pelvic exams and Pap testing every 3 years, or they may recommend the Pap and pelvic exam, combined with testing for human papilloma virus (HPV), every 5 years. Some types of HPV increase your risk of cervical cancer. Testing for HPV may also be done on women of any age with unclear Pap test results.  Other health care providers may not recommend any screening for nonpregnant women who are considered low risk for pelvic cancer and who do not have symptoms. Ask your health care provider if a screening pelvic exam is right for you.  If you have had past treatment for cervical cancer or a condition that could lead to cancer, you need Pap tests and screening for cancer for at least 20 years after your treatment. If Pap tests have been discontinued, your risk factors (such as having a new sexual partner) need to be reassessed to determine if screening should resume. Some women have medical problems that increase the chance of getting cervical cancer. In these cases, your health care provider may recommend more frequent screening and Pap tests.  Colorectal Cancer  This type of cancer can be detected and often prevented.  Routine colorectal cancer screening usually begins at 22 years of age and continues through 22 years of age.  Your health care provider may recommend screening at an earlier age if you have risk factors for colon cancer.  Your health care provider may also recommend using home test kits to check for hidden blood in the stool.  A small camera at the end of a tube can be used to examine your colon directly (sigmoidoscopy or colonoscopy). This is done to check for the earliest forms of colorectal  cancer.  Routine screening usually begins at age 33.  Direct examination of the colon should be repeated every 5-10 years through 22 years of age. However, you may need to be screened more often if early forms of precancerous polyps or small growths are found.  Skin Cancer  Check your skin from head to toe regularly.  Tell your health care provider about any new moles or changes in moles, especially if there is a change in a mole's shape or color.  Also tell your health care provider if you have a mole that is larger than the size of a pencil eraser.  Always use sunscreen. Apply sunscreen liberally and repeatedly throughout the day.  Protect yourself by wearing long sleeves, pants, a wide-brimmed hat, and sunglasses whenever you are outside.  Heart disease, diabetes, and high blood pressure  High blood pressure causes heart disease and increases the risk of stroke. High blood pressure is more likely to develop in: ? People who have blood pressure in the high end of  the normal range (130-139/85-89 mm Hg). ? People who are overweight or obese. ? People who are African American.  If you are 21-29 years of age, have your blood pressure checked every 3-5 years. If you are 3 years of age or older, have your blood pressure checked every year. You should have your blood pressure measured twice-once when you are at a hospital or clinic, and once when you are not at a hospital or clinic. Record the average of the two measurements. To check your blood pressure when you are not at a hospital or clinic, you can use: ? An automated blood pressure machine at a pharmacy. ? A home blood pressure monitor.  If you are between 17 years and 37 years old, ask your health care provider if you should take aspirin to prevent strokes.  Have regular diabetes screenings. This involves taking a blood sample to check your fasting blood sugar level. ? If you are at a normal weight and have a low risk for diabetes,  have this test once every three years after 22 years of age. ? If you are overweight and have a high risk for diabetes, consider being tested at a younger age or more often. Preventing infection Hepatitis B  If you have a higher risk for hepatitis B, you should be screened for this virus. You are considered at high risk for hepatitis B if: ? You were born in a country where hepatitis B is common. Ask your health care provider which countries are considered high risk. ? Your parents were born in a high-risk country, and you have not been immunized against hepatitis B (hepatitis B vaccine). ? You have HIV or AIDS. ? You use needles to inject street drugs. ? You live with someone who has hepatitis B. ? You have had sex with someone who has hepatitis B. ? You get hemodialysis treatment. ? You take certain medicines for conditions, including cancer, organ transplantation, and autoimmune conditions.  Hepatitis C  Blood testing is recommended for: ? Everyone born from [birth year] through 1965. ? Anyone with known risk factors for hepatitis C.  Sexually transmitted infections (STIs)  You should be screened for sexually transmitted infections (STIs) including gonorrhea and chlamydia if: ? You are sexually active and are younger than 22 years of age. ? You are older than 22 years of age and your health care provider tells you that you are at risk for this type of infection. ? Your sexual activity has changed since you were last screened and you are at an increased risk for chlamydia or gonorrhea. Ask your health care provider if you are at risk.  If you do not have HIV, but are at risk, it may be recommended that you take a prescription medicine daily to prevent HIV infection. This is called pre-exposure prophylaxis (PrEP). You are considered at risk if: ? You are sexually active and do not regularly use condoms or know the HIV status of your partner(s). ? You take drugs by injection. ? You are  sexually active with a partner who has HIV.  Talk with your health care provider about whether you are at high risk of being infected with HIV. If you choose to begin PrEP, you should first be tested for HIV. You should then be tested every 3 months for as long as you are taking PrEP. Pregnancy  If you are premenopausal and you may become pregnant, ask your health care provider about preconception counseling.  If you may become  pregnant, take 400 to 800 micrograms (mcg) of folic acid every day.  If you want to prevent pregnancy, talk to your health care provider about birth control (contraception). Osteoporosis and menopause  Osteoporosis is a disease in which the bones lose minerals and strength with aging. This can result in serious bone fractures. Your risk for osteoporosis can be identified using a bone density scan.  If you are 1 years of age or older, or if you are at risk for osteoporosis and fractures, ask your health care provider if you should be screened.  Ask your health care provider whether you should take a calcium or vitamin D supplement to lower your risk for osteoporosis.  Menopause may have certain physical symptoms and risks.  Hormone replacement therapy may reduce some of these symptoms and risks. Talk to your health care provider about whether hormone replacement therapy is right for you. Follow these instructions at home:  Schedule regular health, dental, and eye exams.  Stay current with your immunizations.  Do not use any tobacco products including cigarettes, chewing tobacco, or electronic cigarettes.  If you are pregnant, do not drink alcohol.  If you are breastfeeding, limit how much and how often you drink alcohol.  Limit alcohol intake to no more than 1 drink per day for nonpregnant women. One drink equals 12 ounces of beer, 5 ounces of wine, or 1 ounces of hard liquor.  Do not use street drugs.  Do not share needles.  Ask your health care  provider for help if you need support or information about quitting drugs.  Tell your health care provider if you often feel depressed.  Tell your health care provider if you have ever been abused or do not feel safe at home. This information is not intended to replace advice given to you by your health care provider. Make sure you discuss any questions you have with your health care provider. Document Released: 09/27/2010 Document Revised: 08/20/2015 Document Reviewed: 12/16/2014 Elsevier Interactive Patient Education  2018 [employer].     Urinary Tract Infection, Adult A urinary tract infection (UTI) is an infection of any part of the urinary tract. The urinary tract includes the:  Kidneys.  Ureters.  Bladder.  Urethra.  These organs make, store, and get rid of pee (urine) in the body. Follow these instructions at home:  Take over-the-counter and prescription medicines only as told by your doctor.  If you were prescribed an antibiotic medicine, take it as told by your doctor. Do not stop taking the antibiotic even if you start to feel better.  Avoid the following drinks: ? Alcohol. ? Caffeine. ? Tea. ? Carbonated drinks.  Drink enough fluid to keep your pee clear or pale yellow.  Keep all follow-up visits as told by your doctor. This is important.  Make sure to: ? Empty your bladder often and completely. Do not to hold pee for long periods of time. ? Empty your bladder before and after sex. ? Wipe from front to back after a bowel movement if you are female. Use each tissue one time when you wipe. Contact a doctor if:  You have back pain.  You have a fever.  You feel sick to your stomach (nauseous).  You throw up (vomit).  Your symptoms do not get better after 3 days.  Your symptoms go away and then come back. Get help right away if:  You have very bad back pain.  You have very bad lower belly (abdominal) pain.  You are throwing up and cannot keep  down any medicines or water. This information is not intended to replace advice given to you by your health care provider. Make sure you discuss any questions you have with your health care provider. Document Released: 08/31/2007 Document Revised: 08/20/2015 Document Reviewed: 02/02/2015 Elsevier Interactive Patient Education  [employer].

## 2017-09-22 NOTE — Progress Notes (Signed)
Subjective:  Jordan LeitzMariana Wallace is a 22 y.o. female who presents for wellness visit in order to satisfy employee sponsored health insurance benefits. No recent follow-up with a primary care provider. She has a successful pregnancy in 2016.  She she reports no known family history of any cardiovascular, diabetes, or lung disease.  She does not engage in routine physical activity but plans to start. Body mass index is 24.09 kg/m.  UTI Symptoms Today she complains of dysuria and frequency. She has been treated for bacterial vaginosis twice over the course of one month and denies receiving a UA. Denies fever, chills, CVA tenderness, or nausea or vomiting. Immunization History  Administered Date(s) Administered  . Influenza,inj,Quad PF,6+ Mos 12/01/2014    Past Medical History:  Diagnosis Date  . Anemia   . Depression    doing ok now  . Infection    UTI    Past Surgical History:  Procedure Laterality Date  . CESAREAN SECTION N/A 11/28/2014   Procedure: CESAREAN SECTION;  Surgeon: Jordan HidalgoJennifer Ozan, DO;  Location: [facility];  Service: Obstetrics;  Laterality: N/A;  . FASCIOTOMY     bilateral    Social History   Tobacco Use  . Smoking status: Never Smoker  . Smokeless tobacco: Never Used  Substance Use Topics  . Alcohol use: No  . Drug use: No    Allergies  Allergen Reactions  . Latex Hives    Current Outpatient Medications  Medication Sig Dispense Refill  . levonorgestrel (MIRENA) 20 MCG/24HR IUD 1 each by Intrauterine route once.    Marland Kitchen. acetaminophen (TYLENOL) 500 MG tablet Take 1,000 mg by mouth every 6 (six) hours as needed for mild pain.    . brompheniramine-pseudoephedrine-DM 30-2-10 MG/5ML syrup Take 5 mLs by mouth 4 (four) times daily as needed. (Patient not taking: Reported on 09/22/2017) 240 mL 0  . ferrous sulfate 325 (65 FE) MG tablet Take 1 tablet (325 mg total) by mouth daily with breakfast. (Patient not taking: Reported on 07/06/2017) 30 tablet 3  . ibuprofen (ADVIL,MOTRIN)  600 MG tablet Take 1 tablet (600 mg total) by mouth every 6 (six) hours as needed. (Patient not taking: Reported on 07/06/2017) 30 tablet 2  . oseltamivir (TAMIFLU) 75 MG capsule Take 1 capsule (75 mg total) by mouth 2 (two) times daily. (Patient not taking: Reported on 09/22/2017) 10 capsule 0  . oxyCODONE-acetaminophen (PERCOCET/ROXICET) 5-325 MG per tablet Take 1 tablet by mouth every 4 (four) hours as needed (for pain scale 4-7). (Patient not taking: Reported on 07/06/2017) 30 tablet 0  . Prenatal Vit-Fe Fumarate-FA (PRENATAL MULTIVITAMIN) TABS tablet Take 1 tablet by mouth daily.      No current facility-administered medications for this visit.     ROS Constitutional: Negative for fever, chills, diaphoresis, activity change, appetite change and fatigue. HENT: Negative for ear pain, nosebleeds, congestion, facial swelling, rhinorrhea, neck pain, neck stiffness and ear discharge.  Eyes: Negative for pain, discharge, redness, itching and visual disturbance. Respiratory: Negative for cough, choking, chest tightness, shortness of breath, wheezing and stridor.  Cardiovascular: Negative for chest pain, palpitations and leg swelling. Gastrointestinal: Negative for abdominal distention. Genitourinary: Positive for dysuria, urgency, frequency Musculoskeletal: Negative for back pain, joint swelling, arthralgia and gait problem. Neurological: Negative for dizziness, tremors, seizures, syncope, facial asymmetry, speech difficulty, weakness, light-headedness, numbness and headaches.  Objective:  BP 90/60   Pulse 68   Temp 98.6 F (37 C)   Ht 5\' 3"  (1.6 m)   Wt 136 lb (61.7 kg)  LMP 08/27/2017   SpO2 99%   BMI 24.09 kg/m   General Appearance:  Alert, cooperative, no distress, appears stated age  Head:  Normocephalic, without obvious abnormality, atraumatic  Eyes:  PERRL, conjunctiva/corneas clear, EOM's intact, fundi benign, both eyes  Ears:  Normal TM's and external ear canals, both ears   Nose: Nares normal, septum midline,mucosa normal, no drainage or sinus tenderness  Neck: Supple, symmetrical, trachea midline, no adenopathy;  thyroid: not enlarged, symmetric, no tenderness/mass/nodules; no carotid bruit or JVD  Back:   Symmetric, no curvature, ROM normal, no CVA tenderness  Lungs:   Clear to auscultation bilaterally, respirations unlabored  Breasts:  No masses or tenderness  Heart:  Regular rate and rhythm, S1 and S2 normal, no murmur, rub, or gallop  Abdomen:   Soft, non-tender, bowel sounds active all four quadrants,  no masses, no organomegaly  Extremities: Extremities normal, atraumatic, no cyanosis or edema  Pulses: 2+ and symmetric  Skin: Skin color, texture, turgor normal, no rashes or lesions  Neurologic: Normal    Assessment and Plan:  1. Wellness examination Age-appropriate anticipatory guidance provided  Encouraged efforts to reduce weight include engaging in physical activity as tolerated with goal of 150 minutes per week. Improve dietary choices and eat a meal regimen consistent with a Mediterranean or DASH diet. Reduce simple carbohydrates. Do not skip meals and eat healthy snacks throughout the day to avoid over-eating at dinner. Set a goal weight loss that is achievable for you.Follow-up with PCP to obtain routine screening labs.   2. Acute cystitis with hematuria, uncomplicated. Treat will with a broad-spectrum antibiotic. Urine culture is not available at clinic.  Meds ordered this encounter  Medications  . doxycycline (VIBRAMYCIN) 100 MG capsule    Sig: Take 1 capsule (100 mg total) by mouth 2 (two) times daily.    Dispense:  20 capsule    Refill:  0    Patient education provided. Patient will follow up with PCP.  Jordan Wallace. Jordan Pea, MSN, FNP-C [facility]  [address]  [place], [place] [address] [phone]

## 2017-11-23 ENCOUNTER — Ambulatory Visit (INDEPENDENT_AMBULATORY_CARE_PROVIDER_SITE_OTHER): Payer: Self-pay | Admitting: Family Medicine

## 2017-11-23 ENCOUNTER — Encounter: Payer: Self-pay | Admitting: Family Medicine

## 2017-11-23 VITALS — BP 120/82 | HR 97 | Temp 98.6°F | Wt 136.0 lb

## 2017-11-23 DIAGNOSIS — J029 Acute pharyngitis, unspecified: Secondary | ICD-10-CM

## 2017-11-23 LAB — POCT RAPID STREP A (OFFICE): Rapid Strep A Screen: NEGATIVE

## 2017-11-23 MED ORDER — CEFDINIR 300 MG PO CAPS: 600.0000 mg | ORAL_CAPSULE | Freq: Every day | ORAL | 0 refills | Status: DC

## 2017-11-23 MED ORDER — LIDOCAINE VISCOUS HCL 2 % MT SOLN: 15.0000 mL | OROMUCOSAL | 0 refills | Status: DC | PRN

## 2017-11-23 MED ORDER — FLUCONAZOLE 150 MG PO TABS: 150.0000 mg | ORAL_TABLET | Freq: Once | ORAL | 0 refills | Status: AC

## 2017-11-23 NOTE — Patient Instructions (Signed)
You are being treated today for pharyngitis likely secondary to strep although rapid strep was negative. You are bing treated based on physical exam and current symptoms.  Take all medication as directed. Practice good hand hygiene. You have been provided a work note and may return tomorrow as long as symptoms have not worsen and you are afebrile.     Pharyngitis Pharyngitis is a sore throat (pharynx). There is redness, pain, and swelling of your throat. Follow these instructions at home:  Drink enough fluids to keep your pee (urine) clear or pale yellow.  Only take medicine as told by your doctor. ? You may get sick again if you do not take medicine as told. Finish your medicines, even if you start to feel better. ? Do not take aspirin.  Rest.  Rinse your mouth (gargle) with salt water ( tsp of salt per 1 qt of water) every 1-2 hours. This will help the pain.  If you are not at risk for choking, you can suck on hard candy or sore throat lozenges. Contact a doctor if:  You have large, tender lumps on your neck.  You have a rash.  You cough up green, yellow-brown, or bloody spit. Get help right away if:  You have a stiff neck.  You drool or cannot swallow liquids.  You throw up (vomit) or are not able to keep medicine or liquids down.  You have very bad pain that does not go away with medicine.  You have problems breathing (not from a stuffy nose). This information is not intended to replace advice given to you by your health care provider. Make sure you discuss any questions you have with your health care provider. Document Released: 08/31/2007 Document Revised: 08/20/2015 Document Reviewed: 11/19/2012 Elsevier Interactive Patient Education  2017 [employer]

## 2017-11-23 NOTE — Progress Notes (Signed)
Patient ID: Jordan Wallace, female    DOB: 1995/04/03, 22 y.o.   MRN: [id number]  PCP: No primary care provider on file.  Chief Complaint  Patient presents with  . Choice-sorethroat/ha    Subjective:  HPI  Jordan Wallace is a 22 y.o. female presents for evaluation    SORE THROAT Onset: within the last 24 hours    Severity: moderate Tried OTC motrin without relief   Symptoms:  + chills and sweating  +Swollen neck glands +myalgias +Headache Unknown Recent Strep Exposure      No Rash No URI symptoms   Social History   Socioeconomic History  . Marital status: Single    Spouse name: Not on file  . Number of children: Not on file  . Years of education: Not on file  . Highest education level: Not on file  Occupational History  . Not on file  Social Needs  . Financial resource strain: Not on file  . Food insecurity:    Worry: Not on file    Inability: Not on file  . Transportation needs:    Medical: Not on file    Non-medical: Not on file  Tobacco Use  . Smoking status: Never Smoker  . Smokeless tobacco: Never Used  Substance and Sexual Activity  . Alcohol use: No  . Drug use: No  . Sexual activity: Yes    Birth control/protection: None  Lifestyle  . Physical activity:    Days per week: Not on file    Minutes per session: Not on file  . Stress: Not on file  Relationships  . Social connections:    Talks on phone: Not on file    Gets together: Not on file    Attends religious service: Not on file    Active member of club or organization: Not on file    Attends meetings of clubs or organizations: Not on file    Relationship status: Not on file  . Intimate partner violence:    Fear of current or ex partner: Not on file    Emotionally abused: Not on file    Physically abused: Not on file    Forced sexual activity: Not on file  Other Topics Concern  . Not on file  Social History Narrative  . Not on file    Family History  Problem Relation Age  of Onset  . Alcohol abuse Neg Hx   . Arthritis Neg Hx   . Asthma Neg Hx   . Birth defects Neg Hx   . Cancer Neg Hx   . COPD Neg Hx   . Depression Neg Hx   . Drug abuse Neg Hx   . Early death Neg Hx   . Hearing loss Neg Hx   . Heart disease Neg Hx   . Hyperlipidemia Neg Hx   . Hypertension Neg Hx   . Kidney disease Neg Hx   . Learning disabilities Neg Hx   . Mental illness Neg Hx   . Mental retardation Neg Hx   . Miscarriages / Stillbirths Neg Hx   . Stroke Neg Hx   . Vision loss Neg Hx   . Varicose Veins Neg Hx   . Diabetes Paternal Grandmother   . Diabetes Paternal Grandfather    Review of Systems Pertinent negatives listed in HPI Patient Active Problem List   Diagnosis Date Noted  . Status post primary low transverse cesarean section--FTD 12/01/2014    Allergies  Allergen Reactions  . Latex Hives  Prior to Admission medications   Medication Sig Start Date End Date Taking? Authorizing Provider  levonorgestrel (MIRENA) 20 MCG/24HR IUD 1 each by Intrauterine route once.   Yes [provider]  acetaminophen (TYLENOL) 500 MG tablet Take 1,000 mg by mouth every 6 (six) hours as needed for mild pain.    [provider]  cefdinir (OMNICEF) 300 MG capsule Take 2 capsules (600 mg total) by mouth daily. 11/23/17   Bing Neighbors, FNP  doxycycline (VIBRAMYCIN) 100 MG capsule Take 1 capsule (100 mg total) by mouth 2 (two) times daily. Patient not taking: Reported on 11/23/2017 09/22/17   Bing Neighbors, FNP  fluconazole (DIFLUCAN) 150 MG tablet Take 1 tablet (150 mg total) by mouth once for 1 dose. 11/23/17 11/23/17  Bing Neighbors, FNP  lidocaine (XYLOCAINE) 2 % solution Use as directed 15 mLs in the mouth or throat as needed for mouth pain. 11/23/17   Bing Neighbors, FNP  oxyCODONE-acetaminophen (PERCOCET/ROXICET) 5-325 MG per tablet Take 1 tablet by mouth every 4 (four) hours as needed (for pain scale 4-7). Patient not taking: Reported on 07/06/2017  12/01/14   Nigel Bridgeman, CNM  Prenatal Vit-Fe Fumarate-FA (PRENATAL MULTIVITAMIN) TABS tablet Take 1 tablet by mouth daily.     [provider]    Past Medical, Surgical Family and Social History reviewed and updated.    Objective:   Today's Vitals   11/23/17 0838  BP: 120/82  Pulse: 97  Temp: 98.6 F (37 C)  SpO2: 98%  Weight: 136 lb (61.7 kg)    Wt Readings from Last 3 Encounters:  11/23/17 136 lb (61.7 kg)  09/22/17 136 lb (61.7 kg)  07/06/17 134 lb (60.8 kg)    Physical Exam Constitutional: Patient appears well-developed and well-nourished. No distress. HENT: Normocephalic, atraumatic, External right and left ear normal. Oropharynx is erythematous without exudate. Eyes: Conjunctivae and EOM are normal. PERRLA, no scleral icterus. Neck: Normal ROM. Neck supple. No JVD. No tracheal deviation. No thyromegaly. CVS: RRR, S1/S2 +, no murmurs, no gallops, no carotid bruit.  Pulmonary: Effort and breath sounds normal, no stridor, rhonchi, wheezes, rales.  Lymphadenopathy: Cervical adenopathy present.  Skin: Skin is warm and dry. No rash noted. Not diaphoretic. No erythema. No pallor.   Assessment & Plan:  1. Pharyngitis, unspecified etiology - POCT rapid strep A, negative although physical exam and over-all ill appearance heightens suspicion for possible early streptococcal infection. Will treat empirically with broad spectrum antibiotic. No work for the next 24 hours or longer if fever occurs. Hydration, rest, with food intake as tolerated.  If symptoms worsen or do not improve, return for follow-up, follow-up with PCP, or at the emergency department if severity of symptoms warrant a higher level of care.    Godfrey Pick. Tiburcio Pea, MSN, FNP-C [facility]  [address]  [place], [place] [address] [phone]

## 2017-11-28 ENCOUNTER — Telehealth: Payer: Self-pay | Admitting: Emergency Medicine

## 2017-11-28 NOTE — Telephone Encounter (Signed)
Spoke with patient whom informed me that she is doing so much better. This is a follow up call from [facility].

## 2018-02-05 DIAGNOSIS — Z113 Encounter for screening for infections with a predominantly sexual mode of transmission: Secondary | ICD-10-CM | POA: Diagnosis not present

## 2018-02-05 DIAGNOSIS — R8290 Unspecified abnormal findings in urine: Secondary | ICD-10-CM | POA: Diagnosis not present

## 2018-02-05 DIAGNOSIS — N760 Acute vaginitis: Secondary | ICD-10-CM | POA: Diagnosis not present

## 2018-05-18 ENCOUNTER — Ambulatory Visit (INDEPENDENT_AMBULATORY_CARE_PROVIDER_SITE_OTHER): Payer: Self-pay | Admitting: Physician Assistant

## 2018-05-18 VITALS — BP 110/72 | HR 89 | Temp 98.7°F | Resp 16

## 2018-05-18 DIAGNOSIS — J0111 Acute recurrent frontal sinusitis: Secondary | ICD-10-CM

## 2018-05-18 DIAGNOSIS — T3695XA Adverse effect of unspecified systemic antibiotic, initial encounter: Secondary | ICD-10-CM

## 2018-05-18 DIAGNOSIS — B379 Candidiasis, unspecified: Secondary | ICD-10-CM

## 2018-05-18 MED ORDER — AMOXICILLIN-POT CLAVULANATE 875-125 MG PO TABS: 1.0000 | ORAL_TABLET | Freq: Two times a day (BID) | ORAL | 0 refills | Status: AC

## 2018-05-18 MED ORDER — FLUTICASONE PROPIONATE 50 MCG/ACT NA SUSP: 2.0000 | Freq: Every day | NASAL | 0 refills | Status: DC

## 2018-05-18 MED ORDER — LORATADINE-PSEUDOEPHEDRINE ER 5-120 MG PO TB12: 1.0000 | ORAL_TABLET | Freq: Two times a day (BID) | ORAL | 0 refills | Status: AC

## 2018-05-18 MED ORDER — FLUCONAZOLE 150 MG PO TABS: 150.0000 mg | ORAL_TABLET | Freq: Every day | ORAL | 0 refills | Status: DC

## 2018-05-18 NOTE — Patient Instructions (Addendum)
Thank you for choosing [facility] for your health care needs.  You have been diagnosed with sinusitis.  Take medications as prescribed: Meds ordered this encounter  Medications  . amoxicillin-clavulanate (AUGMENTIN) 875-125 MG tablet    Sig: Take 1 tablet by mouth 2 (two) times daily for 7 days.    Dispense:  14 tablet    Refill:  0    Order Specific Question:   Supervising Provider    Answer:   MILLER, BRIAN [[id number]]  . loratadine-pseudoephedrine (CLARITIN-D 12 HOUR) 5-120 MG tablet    Sig: Take 1 tablet by mouth 2 (two) times daily for 7 days.    Dispense:  14 tablet    Refill:  0    Order Specific Question:   Supervising Provider    Answer:   MILLER, BRIAN [[id number]]  . fluticasone (FLONASE) 50 MCG/ACT nasal spray    Sig: Place 2 sprays into both nostrils daily.    Dispense:  16 g    Refill:  0    Order Specific Question:   Supervising Provider    Answer:   MILLER, BRIAN [[id number]]  . fluconazole (DIFLUCAN) 150 MG tablet    Sig: Take 1 tablet (150 mg total) by mouth daily.    Dispense:  1 tablet    Refill:  0    Order Specific Question:   Supervising Provider    Answer:   MILLER, BRIAN [[id number]]   Increase fluids. Use warm compresses over sinuses. Take hot steamy shower to help eliminate nasal discharge.  Take Diflucan at end of antibiotic course; if you have developed vaginal itchiness and thick white cottage-cheese consistency discharge.  Follow-up with family physician, urgent care, or ENT (ear, nose, and throat specialist) if your symptoms do not improve in 4-5 days. Sooner with worsening symptoms.  Sinusitis, Adult Sinusitis is soreness and swelling (inflammation) of your sinuses. Sinuses are hollow spaces in the bones around your face. They are located:  Around your eyes.  In the middle of your forehead.  Behind your nose.  In your cheekbones. Your sinuses and nasal passages are lined with a fluid called mucus. Mucus drains out of your sinuses. Swelling can trap mucus in  your sinuses. This lets germs (bacteria, virus, or fungus) grow, which leads to infection. Most of the time, this condition is caused by a virus. What are the causes? This condition is caused by:  Allergies.  Asthma.  Germs.  Things that block your nose or sinuses.  Growths in the nose (nasal polyps).  Chemicals or irritants in the air.  Fungus (rare). What increases the risk? You are more likely to develop this condition if:  You have a weak body defense system (immune system).  You do a lot of swimming or diving.  You use nasal sprays too much.  You smoke. What are the signs or symptoms? The main symptoms of this condition are pain and a feeling of pressure around the sinuses. Other symptoms include:  Stuffy nose (congestion).  Runny nose (drainage).  Swelling and warmth in the sinuses.  Headache.  Toothache.  A cough that may get worse at night.  Mucus that collects in the throat or the back of the nose (postnasal drip).  Being unable to smell and taste.  Being very tired (fatigue).  A fever.  Sore throat.  Bad breath. How is this diagnosed? This condition is diagnosed based on:  Your symptoms.  Your medical history.  A physical exam.  Tests to find out if your  condition is short-term (acute) or long-term (chronic). Your doctor may: ? Check your nose for growths (polyps). ? Check your sinuses using a tool that has a light (endoscope). ? Check for allergies or germs. ? Do imaging tests, such as an MRI or CT scan. How is this treated? Treatment for this condition depends on the cause and whether it is short-term or long-term.  If caused by a virus, your symptoms should go away on their own within 10 days. You may be given medicines to relieve symptoms. They include: ? Medicines that shrink swollen tissue in the nose. ? Medicines that treat allergies (antihistamines). ? A spray that treats swelling of the nostrils. ? Rinses that help get rid  of thick mucus in your nose (nasal saline washes).  If caused by bacteria, your doctor may wait to see if you will get better without treatment. You may be given antibiotic medicine if you have: ? A very bad infection. ? A weak body defense system.  If caused by growths in the nose, you may need to have surgery. Follow these instructions at home: Medicines  Take, use, or apply over-the-counter and prescription medicines only as told by your doctor. These may include nasal sprays.  If you were prescribed an antibiotic medicine, take it as told by your doctor. Do not stop taking the antibiotic even if you start to feel better. Hydrate and humidify   Drink enough water to keep your pee (urine) pale yellow.  Use a cool mist humidifier to keep the humidity level in your home above 50%.  Breathe in steam for 10-15 minutes, 3-4 times a day, or as told by your doctor. You can do this in the bathroom while a hot shower is running.  Try not to spend time in cool or dry air. Rest  Rest as much as you can.  Sleep with your head raised (elevated).  Make sure you get enough sleep each night. General instructions   Put a warm, moist washcloth on your face 3-4 times a day, or as often as told by your doctor. This will help with discomfort.  Wash your hands often with soap and water. If there is no soap and water, use hand sanitizer.  Do not smoke. Avoid being around people who are smoking (secondhand smoke).  Keep all follow-up visits as told by your doctor. This is important. Contact a doctor if:  You have a fever.  Your symptoms get worse.  Your symptoms do not get better within 10 days. Get help right away if:  You have a very bad headache.  You cannot stop throwing up (vomiting).  You have very bad pain or swelling around your face or eyes.  You have trouble seeing.  You feel confused.  Your neck is stiff.  You have trouble breathing. Summary  Sinusitis is swelling  of your sinuses. Sinuses are hollow spaces in the bones around your face.  This condition is caused by tissues in your nose that become inflamed or swollen. This traps germs. These can lead to infection.  If you were prescribed an antibiotic medicine, take it as told by your doctor. Do not stop taking it even if you start to feel better.  Keep all follow-up visits as told by your doctor. This is important. This information is not intended to replace advice given to you by your health care provider. Make sure you discuss any questions you have with your health care provider. Document Released: 08/31/2007 Document Revised: 08/14/2017  Document Reviewed: 08/14/2017 Elsevier Interactive Patient Education  [employer].

## 2018-05-18 NOTE — Progress Notes (Signed)
Patient ID: Jordan Wallace DOB: 1996/01/26 AGE: 23 y.o. MRN: [id number]   PCP: No primary care provider on file.   Chief Complaint:  Chief Complaint  Patient presents with  . Nasal Congestion    x1d  . Facial Pain    x1d  . Sinusitis    x1d     Subjective:    HPI:  Jordan Wallace is a 23 y.o. female presents for evaluation  Chief Complaint  Patient presents with  . Nasal Congestion    x1d  . Facial Pain    x1d  . Sinusitis    x84d    23 year old female presents with four week history of sinus issues. Patient had one week of sneezing, rhinorrhea, and nasal congestion. Then developed frontal headache, radiated circumferentially. Associated maxillary sinus pain. Severe. Nerve/burning pain. Radiated to upper teeth. Saw dentist. Diagnosed with single cavity; had filling placed. Dental x-rays revealed fluid in lower portion of bilateral maxillary sinuses. Dentist prescribed patient 10-day course of Penicillin 500mg  qid. Patient admits to taking approximately 7 days of antibiotic and not taking antibiotic qid (averaged bid). Patient states symptoms resolved, felt significantly better.  Began having issues with filling approximately 2 weeks later. Went to see dentist earlier this week, two days ago (Wed 05/16/18). Repeat dental x-rays revealed continued fluid in lower portion of maxillary sinuses. Patient stated she was asymptomatic. Patient yesterday developed return of sneezing, rhinorrhea, and nasal congestion. Nasal discharge thick and green. Associated frontal sinus pressure/pain. Left side worse than right. Minimal postnasal drip. Has taken OTC ibuprofen with minimal relief. Denies fever, chills, malaise, body aches, dizziness/lightheadedness, ear pain, tinnitus, epistaxis, sore throat, cough, chest pain, SOB, wheezing. Reports bilateral eye pressure (behind eyes); denies change in vision, eye pruritis, eye ball pain, pain with eye movement, eye discharge/drainage including  increase in tear production. Patient denies previous history of sinusitis.  Patient with recent issue of bacterial vaginosis; 5 weeks ago. Resolved with Metrogel. Patient felt symptoms returned, requested refill, prescribed Flagyl 500mg  oral. Felt symptoms self-resolved, still has antibiotic, has not taken. Patient states she has had a vaginal yeast infection after course of antibiotics previously. Patient states she has been having alternating issues for the past 6 months to 1 year, in regards to BV and vaginal yeast. Patient has IUD. Has had semi recent STD testing, negative.  A limited review of symptoms was performed, pertinent positives and negatives as mentioned in HPI.  The following portions of the patient's history were reviewed and updated as appropriate: allergies, current medications and past medical history.  Patient Active Problem List   Diagnosis Date Noted  . Status post primary low transverse cesarean section--FTD 12/01/2014    Allergies  Allergen Reactions  . Latex Hives    Current Outpatient Medications on File Prior to Visit  Medication Sig Dispense Refill  . levonorgestrel (MIRENA) 20 MCG/24HR IUD 1 each by Intrauterine route once.    . metroNIDAZOLE (FLAGYL) 500 MG tablet     . metroNIDAZOLE (METROGEL) 0.75 % vaginal gel     . acetaminophen (TYLENOL) 500 MG tablet Take 1,000 mg by mouth every 6 (six) hours as needed for mild pain.    Marland Kitchen penicillin v potassium (VEETID) 500 MG tablet TAKE 1 TABLET BY MOUTH EVERY 6 HOURS UNTIL FINISHED    . Prenatal Vit-Fe Fumarate-FA (PRENATAL MULTIVITAMIN) TABS tablet Take 1 tablet by mouth daily.      No current facility-administered medications on file prior to visit.  Objective:   Vitals:   05/18/18 1305  BP: 110/72  Pulse: 89  Resp: 16  Temp: 98.7 F (37.1 C)  SpO2: 99%     Wt Readings from Last 3 Encounters:  11/23/17 136 lb (61.7 kg)  09/22/17 136 lb (61.7 kg)  07/06/17 134 lb (60.8 kg)    Physical  Exam:   General Appearance:  Patient sitting comfortably on examination table. Conversational. Peri [place] self-historian. In no acute distress. Afebrile.   Head:  Normocephalic, without obvious abnormality, atraumatic  Eyes:  PERRL, conjunctiva/corneas clear, EOM's intact  Ears:  Bilateral ear canals WNL. No erythema or edema. No discharge/drainage. Bilateral TMs WNL. No erythema, injection, or serous effusion. No scar tissue.  Nose: Nares normal, septum midline. Nasal mucosa with bilateral edema and scant clear rhinorrhea. No erythema. No epistaxis. Minimal sinus tenderness with palpation over ethmoid/frontal sinuses. No pain with clenching teeth.  Throat: Lips, mucosa, and tongue normal; teeth and gums normal. Throat reveals faint erythema, cobblestoning, and visible postnasal drip. Tonsils with no enlargement or exudate.  Neck: Supple, symmetrical, trachea midline, no adenopathy  Lungs:   Clear to auscultation bilaterally, respirations unlabored  Heart:  Regular rate and rhythm, S1 and S2 normal, no murmur, rub, or gallop  Extremities: Extremities normal, atraumatic, no cyanosis or edema  Pulses: 2+ and symmetric  Skin: Skin color, texture, turgor normal, no rashes or lesions  Lymph nodes: Cervical, supraclavicular, and axillary nodes normal  Neurologic: Normal    Assessment & Plan:    Exam findings, diagnosis etiology and medication use and indications reviewed with patient. Follow-Up and discharge instructions provided. No emergent/urgent issues found on exam.  Patient education was provided.   Patient verbalized understanding of information provided and agrees with plan of care (POC), all questions answered. The patient is advised to call or return to clinic if condition does not see an improvement in symptoms, or to seek the care of the closest emergency department if condition worsens with the below plan.    1. Acute recurrent frontal sinusitis - amoxicillin-clavulanate (AUGMENTIN)  875-125 MG tablet; Take 1 tablet by mouth 2 (two) times daily for 7 days.  Dispense: 14 tablet; Refill: 0 - loratadine-pseudoephedrine (CLARITIN-D 12 HOUR) 5-120 MG tablet; Take 1 tablet by mouth 2 (two) times daily for 7 days.  Dispense: 14 tablet; Refill: 0 - fluticasone (FLONASE) 50 MCG/ACT nasal spray; Place 2 sprays into both nostrils daily.  Dispense: 16 g; Refill: 0  2. Antibiotic-induced yeast infection - fluconazole (DIFLUCAN) 150 MG tablet; Take 1 tablet (150 mg total) by mouth daily.  Dispense: 1 tablet; Refill: 0  Patient with recurrent sinusitis. VSS, afebrile, in no acute distress. Suspect bacterial acute maxillary sinusitis at time of dental visit. Penicillin with high resistance rates; was also not taken for complete course and not taken at prescribed dosage (patient took bid instead of qid). Rebound symptoms. At this time, will prescribe Augmentin 7-day course (advised for treatment failure per 2019 EMRA Antibiotic Guide). Also prescribed Claritin-D and Flonase to help with congestion. Prescribed Diflucan for possible iatrogenic vaginal yeast infection. Advised patient follow-up with PCP, urgent care, or ENT in 4-5 days if sinus symptoms not improving. Advised patient follow-up with PCP or Ob/Gyn if concern for BV or vaginal yeast (that does not resolve with single dose of Diflucan). Patient agreed with plan.   Janalyn Harder, MHS, PA-C Rulon Sera, MHS, PA-C Advanced Practice Provider [facility]  [address] [facility] [address] [place],  [place] [id number] (p):  [phone] [email or web address]

## 2018-05-23 ENCOUNTER — Telehealth: Payer: Self-pay | Admitting: Emergency Medicine

## 2018-05-23 NOTE — Telephone Encounter (Signed)
Spoke with patient whom informed me that she is doing a lot better. This was a follow up call from visit with [facility].

## 2019-09-22 ENCOUNTER — Encounter (HOSPITAL_COMMUNITY): Payer: Self-pay | Admitting: Obstetrics & Gynecology

## 2019-09-22 ENCOUNTER — Inpatient Hospital Stay (HOSPITAL_COMMUNITY): Payer: No Typology Code available for payment source

## 2019-09-22 ENCOUNTER — Other Ambulatory Visit: Payer: Self-pay

## 2019-09-22 ENCOUNTER — Inpatient Hospital Stay (HOSPITAL_COMMUNITY)
Admission: AD | Admit: 2019-09-22 | Discharge: 2019-09-22 | Disposition: A | Discharge status: Home / Self Care | Payer: No Typology Code available for payment source | Attending: Obstetrics & Gynecology | Admitting: Obstetrics & Gynecology

## 2019-09-22 DIAGNOSIS — O99891 Other specified diseases and conditions complicating pregnancy: Secondary | ICD-10-CM | POA: Diagnosis not present

## 2019-09-22 DIAGNOSIS — Z87440 Personal history of urinary (tract) infections: Secondary | ICD-10-CM | POA: Diagnosis not present

## 2019-09-22 DIAGNOSIS — M549 Dorsalgia, unspecified: Secondary | ICD-10-CM | POA: Diagnosis not present

## 2019-09-22 DIAGNOSIS — Z79899 Other long term (current) drug therapy: Secondary | ICD-10-CM | POA: Insufficient documentation

## 2019-09-22 DIAGNOSIS — R319 Hematuria, unspecified: Secondary | ICD-10-CM

## 2019-09-22 DIAGNOSIS — N132 Hydronephrosis with renal and ureteral calculous obstruction: Secondary | ICD-10-CM | POA: Diagnosis not present

## 2019-09-22 DIAGNOSIS — N3289 Other specified disorders of bladder: Secondary | ICD-10-CM | POA: Insufficient documentation

## 2019-09-22 DIAGNOSIS — O26831 Pregnancy related renal disease, first trimester: Secondary | ICD-10-CM | POA: Insufficient documentation

## 2019-09-22 DIAGNOSIS — N200 Calculus of kidney: Secondary | ICD-10-CM

## 2019-09-22 DIAGNOSIS — R109 Unspecified abdominal pain: Secondary | ICD-10-CM | POA: Insufficient documentation

## 2019-09-22 DIAGNOSIS — O26899 Other specified pregnancy related conditions, unspecified trimester: Secondary | ICD-10-CM

## 2019-09-22 DIAGNOSIS — O26891 Other specified pregnancy related conditions, first trimester: Secondary | ICD-10-CM | POA: Insufficient documentation

## 2019-09-22 DIAGNOSIS — Z3A09 9 weeks gestation of pregnancy: Secondary | ICD-10-CM | POA: Insufficient documentation

## 2019-09-22 LAB — CBC WITH DIFFERENTIAL/PLATELET
Abs Immature Granulocytes: 0.04 10*3/uL (ref 0.00–0.07)
Basophils Absolute: 0 10*3/uL (ref 0.0–0.1)
Basophils Relative: 0 %
Eosinophils Absolute: 0.1 10*3/uL (ref 0.0–0.5)
Eosinophils Relative: 1 %
HCT: 38.7 % (ref 36.0–46.0)
Hemoglobin: 13.1 g/dL (ref 12.0–15.0)
Immature Granulocytes: 0 %
Lymphocytes Relative: 20 %
Lymphs Abs: 2.3 10*3/uL (ref 0.7–4.0)
MCH: 30.8 pg (ref 26.0–34.0)
MCHC: 33.9 g/dL (ref 30.0–36.0)
MCV: 90.8 fL (ref 80.0–100.0)
Monocytes Absolute: 0.7 10*3/uL (ref 0.1–1.0)
Monocytes Relative: 6 %
Neutro Abs: 8.3 10*3/uL — ABNORMAL HIGH (ref 1.7–7.7)
Neutrophils Relative %: 73 %
Platelets: 205 10*3/uL (ref 150–400)
RBC: 4.26 MIL/uL (ref 3.87–5.11)
RDW: 11.9 % (ref 11.5–15.5)
WBC: 11.5 10*3/uL — ABNORMAL HIGH (ref 4.0–10.5)
nRBC: 0 % (ref 0.0–0.2)

## 2019-09-22 LAB — URINALYSIS, ROUTINE W REFLEX MICROSCOPIC
Bilirubin Urine: NEGATIVE
Glucose, UA: NEGATIVE mg/dL
Ketones, ur: NEGATIVE mg/dL
Leukocytes,Ua: NEGATIVE
Nitrite: NEGATIVE
Protein, ur: 30 mg/dL — AB
RBC / HPF: >50 RBC/hpf — ABNORMAL HIGH (ref 0–5)
Specific Gravity, Urine: 1.027 (ref 1.005–1.030)
pH: 5 (ref 5.0–8.0)

## 2019-09-22 LAB — COMPREHENSIVE METABOLIC PANEL
ALT: 26 U/L (ref 0–44)
AST: 24 U/L (ref 15–41)
Albumin: 3.5 g/dL (ref 3.5–5.0)
Alkaline Phosphatase: 47 U/L (ref 38–126)
Anion gap: 11 (ref 5–15)
BUN: 8 mg/dL (ref 6–20)
CO2: 20 mmol/L — ABNORMAL LOW (ref 22–32)
Calcium: 9.4 mg/dL (ref 8.9–10.3)
Chloride: 104 mmol/L (ref 98–111)
Creatinine, Ser: 0.52 mg/dL (ref 0.44–1.00)
GFR calc Af Amer: >60 mL/min (ref >60)
GFR calc non Af Amer: >60 mL/min (ref >60)
Glucose, Bld: 92 mg/dL (ref 70–99)
Potassium: 4 mmol/L (ref 3.5–5.1)
Sodium: 135 mmol/L (ref 135–145)
Total Bilirubin: 0.4 mg/dL (ref 0.3–1.2)
Total Protein: 7 g/dL (ref 6.5–8.1)

## 2019-09-22 LAB — POCT PREGNANCY, URINE: Preg Test, Ur: POSITIVE — AB

## 2019-09-22 MED ORDER — TAMSULOSIN HCL 0.4 MG PO CAPS
0.8000 mg | ORAL_CAPSULE | Freq: Every day | ORAL | Status: DC
  Administered 2019-09-22: 0.8 mg via ORAL
  Filled 2019-09-22: qty 2

## 2019-09-22 MED ORDER — OXYCODONE-ACETAMINOPHEN 5-325 MG PO TABS: 2.0000 | ORAL_TABLET | Freq: Four times a day (QID) | ORAL | 0 refills | Status: DC | PRN

## 2019-09-22 MED ORDER — SODIUM CHLORIDE 0.9 % IV SOLN: 25.0000 mg | Freq: Once | INTRAVENOUS | Status: DC

## 2019-09-22 MED ORDER — TAMSULOSIN HCL 0.4 MG PO CAPS: 0.4000 mg | ORAL_CAPSULE | Freq: Every day | ORAL | 0 refills | Status: DC

## 2019-09-22 MED ORDER — LACTATED RINGERS IV SOLN: INTRAVENOUS | Status: DC

## 2019-09-22 MED ORDER — OXYCODONE-ACETAMINOPHEN 5-325 MG PO TABS
2.0000 | ORAL_TABLET | Freq: Once | ORAL | Status: AC
  Administered 2019-09-22: 2 via ORAL
  Filled 2019-09-22: qty 2

## 2019-09-22 MED ORDER — PROMETHAZINE HCL 25 MG/ML IJ SOLN
25.0000 mg | Freq: Once | INTRAMUSCULAR | Status: AC
  Administered 2019-09-22: 25 mg via INTRAVENOUS
  Filled 2019-09-22: qty 1

## 2019-09-22 MED ORDER — HYDROMORPHONE HCL 1 MG/ML IJ SOLN
1.0000 mg | Freq: Once | INTRAMUSCULAR | Status: AC
  Administered 2019-09-22: 1 mg via INTRAVENOUS
  Filled 2019-09-22: qty 1

## 2019-09-22 NOTE — MAU Provider Note (Addendum)
History     CSN: [id number]  Arrival date and time: 09/22/19 1600   First Provider Initiated Contact with Patient 09/22/19 1705      Chief Complaint  Patient presents with  . Abdominal Pain   HPI   Ms.Jordan Wallace is a 24 y.o. female G2P1001 @[redacted]w[redacted]d  here with right flank/back pain that radiates around to the front of her RLQ. The pain comes and goes. The pain is sharp and shooting. Tylenol has helped the pain some. She reports her urine being dark in color. No bleeding. Eating or drinking does not worsen her pain. + nausea, no vomiting.   Had that confirmed IUP in the office this week.   OB History    Gravida  2   Para  1   Term  1   Preterm      AB      Living  1     SAB      TAB      Ectopic      Multiple  0   Live Births  1           Past Medical History:  Diagnosis Date  . Anemia   . Depression    doing ok now  . Infection    UTI    Past Surgical History:  Procedure Laterality Date  . CESAREAN SECTION N/A 11/28/2014   Procedure: CESAREAN SECTION;  Surgeon: 01/28/2015, DO;  Location: [facility];  Service: Obstetrics;  Laterality: N/A;  . FASCIOTOMY     bilateral    Family History  Problem Relation Age of Onset  . Diabetes Paternal Grandmother   . Diabetes Paternal Grandfather   . Alcohol abuse Neg Hx   . Arthritis Neg Hx   . Asthma Neg Hx   . Birth defects Neg Hx   . Cancer Neg Hx   . COPD Neg Hx   . Depression Neg Hx   . Drug abuse Neg Hx   . Early death Neg Hx   . Hearing loss Neg Hx   . Heart disease Neg Hx   . Hyperlipidemia Neg Hx   . Hypertension Neg Hx   . Kidney disease Neg Hx   . Learning disabilities Neg Hx   . Mental illness Neg Hx   . Mental retardation Neg Hx   . Miscarriages / Stillbirths Neg Hx   . Stroke Neg Hx   . Vision loss Neg Hx   . Varicose Veins Neg Hx     Social History   Tobacco Use  . Smoking status: Never Smoker  . Smokeless tobacco: Never Used  Substance Use Topics  . Alcohol use: No   . Drug use: No    Allergies:  Allergies  Allergen Reactions  . Latex Hives    Medications Prior to Admission  Medication Sig Dispense Refill Last Dose  . acetaminophen (TYLENOL) 500 MG tablet Take 1,000 mg by mouth every 6 (six) hours as needed for mild pain.     . fluconazole (DIFLUCAN) 150 MG tablet Take 1 tablet (150 mg total) by mouth daily. 1 tablet 0   . fluticasone (FLONASE) 50 MCG/ACT nasal spray Place 2 sprays into both nostrils daily. 16 g 0   . levonorgestrel (MIRENA) 20 MCG/24HR IUD 1 each by Intrauterine route once.     . metroNIDAZOLE (FLAGYL) 500 MG tablet      . metroNIDAZOLE (METROGEL) 0.75 % vaginal gel      . penicillin v potassium (VEETID)  500 MG tablet TAKE 1 TABLET BY MOUTH EVERY 6 HOURS UNTIL FINISHED     . Prenatal Vit-Fe Fumarate-FA (PRENATAL MULTIVITAMIN) TABS tablet Take 1 tablet by mouth daily.       Results for orders placed or performed during the hospital encounter of 09/22/19 (from the past 48 hour(s))  Urinalysis, Routine w reflex microscopic     Status: Abnormal   Collection Time: 09/22/19  4:44 PM  Result Value Ref Range   Color, Urine AMBER (A) YELLOW    Comment: BIOCHEMICALS MAY BE AFFECTED BY COLOR   APPearance HAZY (A) CLEAR   Specific Gravity, Urine 1.027 1.005 - 1.030   pH 5.0 5.0 - 8.0   Glucose, UA NEGATIVE NEGATIVE mg/dL   Hgb urine dipstick LARGE (A) NEGATIVE   Bilirubin Urine NEGATIVE NEGATIVE   Ketones, ur NEGATIVE NEGATIVE mg/dL   Protein, ur 30 (A) NEGATIVE mg/dL   Nitrite NEGATIVE NEGATIVE   Leukocytes,Ua NEGATIVE NEGATIVE   RBC / HPF >50 (H) 0 - 5 RBC/hpf   WBC, UA 0-5 0 - 5 WBC/hpf   Bacteria, UA RARE (A) NONE SEEN   Squamous Epithelial / LPF 6-10 0 - 5   Mucus PRESENT     Comment: Performed at [facility], [address] [place], [place] [address]  Pregnancy, urine POC     Status: Abnormal   Collection Time: 09/22/19  4:46 PM  Result Value Ref Range   Preg Test, Ur POSITIVE (A) NEGATIVE    Comment:         THE SENSITIVITY OF THIS METHODOLOGY IS >24 mIU/mL   CBC with Differential/Platelet     Status: Abnormal   Collection Time: 09/22/19  5:34 PM  Result Value Ref Range   WBC 11.5 (H) 4.0 - 10.5 K/uL   RBC 4.26 3.87 - 5.11 MIL/uL   Hemoglobin 13.1 12.0 - 15.0 g/dL   HCT 54.0 36 - 46 %   MCV 90.8 80.0 - 100.0 fL   MCH 30.8 26.0 - 34.0 pg   MCHC 33.9 30.0 - 36.0 g/dL   RDW 08.6 76.1 - 95.0 %   Platelets 205 150 - 400 K/uL   nRBC 0.0 0.0 - 0.2 %   Neutrophils Relative % 73 %   Neutro Abs 8.3 (H) 1.7 - 7.7 K/uL   Lymphocytes Relative 20 %   Lymphs Abs 2.3 0.7 - 4.0 K/uL   Monocytes Relative 6 %   Monocytes Absolute 0.7 0 - 1 K/uL   Eosinophils Relative 1 %   Eosinophils Absolute 0.1 0 - 0 K/uL   Basophils Relative 0 %   Basophils Absolute 0.0 0 - 0 K/uL   Immature Granulocytes 0 %   Abs Immature Granulocytes 0.04 0.00 - 0.07 K/uL    Comment: Performed at [facility], [address], [place], [place] [address]  Comprehensive metabolic panel     Status: Abnormal   Collection Time: 09/22/19  5:34 PM  Result Value Ref Range   Sodium 135 135 - 145 mmol/L   Potassium 4.0 3.5 - 5.1 mmol/L   Chloride 104 98 - 111 mmol/L   CO2 20 (L) 22 - 32 mmol/L   Glucose, Bld 92 70 - 99 mg/dL    Comment: Glucose reference range applies only to samples taken after fasting for at least 8 hours.   BUN 8 6 - 20 mg/dL   Creatinine, Ser 1.24 0.44 - 1.00 mg/dL   Calcium 9.4 8.9 - 58.0 mg/dL  Total Protein 7.0 6.5 - 8.1 g/dL   Albumin 3.5 3.5 - 5.0 g/dL   AST 24 15 - 41 U/L   ALT 26 0 - 44 U/L   Alkaline Phosphatase 47 38 - 126 U/L   Total Bilirubin 0.4 0.3 - 1.2 mg/dL   GFR calc non Af Amer >60 >60 mL/min   GFR calc Af Amer >60 >60 mL/min   Anion gap 11 5 - 15    Comment: Performed at [place] [address], [place], [place] [address]   US RENAL  Result Date: 09/22/2019 CLINICAL DATA:  Hematuria and back pain EXAM: RENAL / URINARY TRACT ULTRASOUND COMPLETE COMPARISON:  None.  FINDINGS: Right Kidney: Renal measurements: 11.5 x 4.6 x 4.8 cm = volume: 134 mL. There is mild right-sided hydronephrosis. Multiple nonobstructing right renal calculi are seen largest in the lower pole measuring 4 mm. Mild distension of the proximal right ureter is noted. The mid to distal right ureter is not well visualized due to bowel gas. Left Kidney: Renal measurements: 10.3 x 5.2 x 6.5 cm = volume: 184 mL. Echogenicity within normal limits. No mass or hydronephrosis visualized. Bladder: Appears normal for degree of bladder distention. Other: None. IMPRESSION: 1. Mild right-sided hydronephrosis and proximal right hydroureter. Cause of obstruction is not visualized on this exam. CT may be useful for further evaluation. 2. Multiple nonobstructing right renal calculi largest measuring 4 mm. 3. Unremarkable left kidney. Electronically Signed   By: Randa Ngo M.D.   On: 09/22/2019 19:09   Review of Systems  Constitutional: Negative for fever.  Gastrointestinal: Positive for nausea. Negative for vomiting.  Genitourinary: Positive for flank pain. Negative for dysuria, frequency and urgency.   Physical Exam   Blood pressure 117/70, pulse 86, temperature 98.3 F (36.8 C), temperature source Oral, resp. rate 16, weight 67.6 kg, last menstrual period 07/18/2019, SpO2 100 %, unknown if currently breastfeeding.  Physical Exam Vitals and nursing note reviewed.  Constitutional:      Appearance: Normal appearance.  HENT:     Head: Normocephalic.  Eyes:     Pupils: Pupils are equal, round, and reactive to light.  Abdominal:     Tenderness: There is abdominal tenderness in the right upper quadrant, right lower quadrant, epigastric area, periumbilical area and suprapubic area. There is rebound. There is no right CVA tenderness or guarding.  Musculoskeletal:        General: Normal range of motion.  Skin:    General: Skin is warm.  Neurological:     Mental Status: She is alert.  Psychiatric:         Mood and Affect: Mood normal.    MAU Course  Procedures  None  MDM  CBC, CMP, UA with large hematuria. Renal US shows multiple stones Urology called at 1930: Phenergan and dilaudid given IV with IVF bolus.  Pain down to 4/10 Updated Dr. Nelda Marseille regarding patient's arrival and plan of care. Waiting for urology to return page.  Spoke to Dr. Lovena Neighbours with urology he recommends trial of oral pain medication, flomax, and phenergan. If patient's pain responds well to she can go home with outpatient treatment with f/u with urology in the office this week.  Strict return precautions if pain worsens Percocet 2 tablets given oral.  Pain back up with 8/10. Report given to V. Stann Mainland CNM who resumes care of the patient. The patient is very aware of the plan of care.   Rasch, Artist Pais, NP  Reassessment @  [phone] after treatment in MAU with percocet and flomax. Patient reports pain is resolved and rates 0/10. She denies N/V.  Educated and discussed plan or care with patient. Rx for percocet and flomax sent to 24 hours pharmacy so that patient can pick up medication today. Discussed importance of calling urologist to schedule follow up appt, patient verbalizes understanding.   Reviewed strict return precaution if pain worsens  Pt stable at time of discharge.   Assessment and Plan   1. Kidney stone complicating pregnancy, first trimester   2. Hematuria   3. Back pain affecting pregnancy   4. Abdominal pain in pregnancy    Discharge home Follow up as scheduled in the office for prenatal care Call urologist to make appointment for follow up  Rx for percocet and flomax  Return to MAU as needed for reasons discussed and/or emergencies    Follow-up Information    Winter, Dorian Furnace, MD. Schedule an appointment as soon as possible for a visit.   Specialty: Urology Contact information: [address] [place] [address] [phone]        Myna Hidalgo, DO Follow up.    Specialty: Obstetrics and Gynecology Why: Follow up as scheduled for prenatal care Contact information: [address] [place] [address] [phone]              Allergies as of 09/22/2019      Reactions   Latex Hives      Medication List    STOP taking these medications   fluconazole 150 MG tablet Commonly known as: Diflucan   levonorgestrel 20 MCG/24HR IUD Commonly known as: MIRENA   metroNIDAZOLE 0.75 % vaginal gel Commonly known as: METROGEL   metroNIDAZOLE 500 MG tablet Commonly known as: FLAGYL     TAKE these medications   acetaminophen 500 MG tablet Commonly known as: TYLENOL Take 1,000 mg by mouth every 6 (six) hours as needed for mild pain.   fluticasone 50 MCG/ACT nasal spray Commonly known as: FLONASE Place 2 sprays into both nostrils daily.   oxyCODONE-acetaminophen 5-325 MG tablet Commonly known as: PERCOCET/ROXICET Take 2 tablets by mouth every 6 (six) hours as needed for severe pain.   penicillin v potassium 500 MG tablet Commonly known as: VEETID TAKE 1 TABLET BY MOUTH EVERY 6 HOURS UNTIL FINISHED   prenatal multivitamin Tabs tablet Take 1 tablet by mouth daily.   tamsulosin 0.4 MG Caps capsule Commonly known as: FLOMAX Take 1 capsule (0.4 mg total) by mouth daily. Start taking on: September 23, 2019       Sharyon Cable, [place] 09/22/19, 10:16 PM

## 2019-09-22 NOTE — MAU Note (Signed)
Jordan Wallace is a 24 y.o. at Unknown here in MAU reporting:  +lower right abdominal pain that radiates to her back Tried tylenol. Last took at 3pm today. Sharp and constant today LMP: 07/18/19 Onset of complaint: on and off; was seen by the [facility] and had an ultrasound where she states they ruled out ectopic. Pain score: 9/10 Vitals:   09/22/19 1638  BP: 117/70  Pulse: 86  Resp: 16  SpO2: 100%     Lab orders placed from triage: ua and poc pregnancy test

## 2019-09-24 LAB — CULTURE, OB URINE

## 2019-10-06 DIAGNOSIS — N3000 Acute cystitis without hematuria: Secondary | ICD-10-CM | POA: Diagnosis not present

## 2019-10-06 DIAGNOSIS — R300 Dysuria: Secondary | ICD-10-CM | POA: Diagnosis not present

## 2019-10-16 ENCOUNTER — Ambulatory Visit: Payer: No Typology Code available for payment source | Admitting: Urology

## 2019-10-16 ENCOUNTER — Encounter: Payer: Self-pay | Admitting: Urology

## 2019-12-19 ENCOUNTER — Telehealth: Payer: Self-pay | Admitting: Obstetrics and Gynecology

## 2019-12-19 NOTE — Telephone Encounter (Signed)
We have received records for NOB transfer care from [facility]. Called and left voicemail for patient to call back to be scheduled.

## 2020-01-10 ENCOUNTER — Ambulatory Visit (INDEPENDENT_AMBULATORY_CARE_PROVIDER_SITE_OTHER): Payer: No Typology Code available for payment source | Admitting: Obstetrics and Gynecology

## 2020-01-10 ENCOUNTER — Other Ambulatory Visit: Payer: Self-pay

## 2020-01-10 VITALS — BP 110/70 | Ht 63.0 in | Wt 162.4 lb

## 2020-01-10 DIAGNOSIS — Z3A25 25 weeks gestation of pregnancy: Secondary | ICD-10-CM

## 2020-01-10 DIAGNOSIS — F32A Depression, unspecified: Secondary | ICD-10-CM

## 2020-01-10 DIAGNOSIS — Z3480 Encounter for supervision of other normal pregnancy, unspecified trimester: Secondary | ICD-10-CM

## 2020-01-10 DIAGNOSIS — O34219 Maternal care for unspecified type scar from previous cesarean delivery: Secondary | ICD-10-CM

## 2020-01-10 DIAGNOSIS — F419 Anxiety disorder, unspecified: Secondary | ICD-10-CM

## 2020-01-10 DIAGNOSIS — N200 Calculus of kidney: Secondary | ICD-10-CM

## 2020-01-10 DIAGNOSIS — O0990 Supervision of high risk pregnancy, unspecified, unspecified trimester: Secondary | ICD-10-CM | POA: Insufficient documentation

## 2020-01-10 LAB — POCT URINALYSIS DIPSTICK OB
Glucose, UA: NEGATIVE
POC,PROTEIN,UA: NEGATIVE

## 2020-01-10 NOTE — Progress Notes (Signed)
NOB transfer Paper records Fucus Plan

## 2020-01-10 NOTE — Progress Notes (Signed)
New Obstetric Patient H&P    Chief Complaint: "Desires prenatal care"   History of Present Illness: Patient is a 24 y.o. 502P1001 Hispanic or Latino female, presents with amenorrhea and positive home pregnancy test. Patient's last menstrual period was 07/18/2019. and based on her  LMP, her EDD is Estimated Date of Delivery: 04/23/20 this has been confirmed by 9 week US. Her last pap smear was on 08/2017 and was normal    She had a urine pregnancy test which was positive on 08/21/2019. Her last menstrual period was normal. She denies vaginal bleeding currently, did have an episode of first trimester spotting earlier in the pregnancy. Her past medical history is contibutory for nephrolithiasis. Her prior pregnancies are notable for cesarean section  Since her LMP, she admits to the use of tobacco products  no There are cats in the home in the home  no She admits close contact with children on a regular basis  yes  She has had chicken pox in the past yes She has had Tuberculosis exposures, symptoms, or previously tested positive for TB   no Current or past history of domestic violence. no  Genetic Screening/Teratology Counseling: (Includes patient, baby's father, or anyone in either family with:)   1. Patient's age >/= 3235 [facility]  no 2. Thalassemia ([place], [place], Mediterranean, or Asian background): MCV<80  no 3. Neural tube defect (meningomyelocele, spina bifida, anencephaly)  no 4. Congenital heart defect  no  5. Down syndrome  no 6. Tay-Sachs (Jewish, [place])  no 7. Canavan's Disease  no 8. Sickle cell disease or trait (African)  no  9. Hemophilia or other blood disorders  no  10. Muscular dystrophy  no  11. Cystic fibrosis  no  12. Huntington's Chorea  no  13. Mental retardation/autism  no 14. Other inherited genetic or chromosomal disorder  no 15. Maternal metabolic disorder (DM, PKU, etc)  no 16. Patient or FOB with a child with a birth defect not listed above no  16a.  Patient or FOB with a birth defect themselves no 17. Recurrent pregnancy loss, or stillbirth  no  18. Any medications since LMP other than prenatal vitamins (include vitamins, supplements, OTC meds, drugs, alcohol)  no 19. Any other genetic/environmental exposure to discuss  no  Infection History:   1. Lives with someone with TB or TB exposed  no  2. Patient or partner has history of genital herpes  no 3. Rash or viral illness since LMP  no 4. History of STI (GC, CT, HPV, syphilis, HIV)  no 5. History of recent travel :  no  Other pertinent information:  no     Review of Systems:10 point review of systems negative unless otherwise noted in HPI  Past Medical History:  Patient Active Problem List   Diagnosis Date Noted  . Status post primary low transverse cesarean section--FTD 12/01/2014    Past Surgical History:  Past Surgical History:  Procedure Laterality Date  . CESAREAN SECTION N/A 11/28/2014   Procedure: CESAREAN SECTION;  Surgeon: Myna HidalgoJennifer Ozan, DO;  Location: [facility];  Service: Obstetrics;  Laterality: N/A;  . FASCIOTOMY     bilateral    Gynecologic History: Patient's last menstrual period was 07/18/2019.  Obstetric History: G2P1001  Family History:  Family History  Problem Relation Age of Onset  . Diabetes Paternal Grandmother   . Diabetes Paternal Grandfather   . Alcohol abuse Neg Hx   . Arthritis Neg Hx   . Asthma Neg Hx   .  Birth defects Neg Hx   . Cancer Neg Hx   . COPD Neg Hx   . Depression Neg Hx   . Drug abuse Neg Hx   . Early death Neg Hx   . Hearing loss Neg Hx   . Heart disease Neg Hx   . Hyperlipidemia Neg Hx   . Hypertension Neg Hx   . Kidney disease Neg Hx   . Learning disabilities Neg Hx   . Mental illness Neg Hx   . Mental retardation Neg Hx   . Miscarriages / Stillbirths Neg Hx   . Stroke Neg Hx   . Vision loss Neg Hx   . Varicose Veins Neg Hx     Social History:  Social History   Socioeconomic History  . Marital status:  Single    Spouse name: Not on file  . Number of children: Not on file  . Years of education: Not on file  . Highest education level: Not on file  Occupational History  . Not on file  Tobacco Use  . Smoking status: Never Smoker  . Smokeless tobacco: Never Used  Substance and Sexual Activity  . Alcohol use: No  . Drug use: No  . Sexual activity: Yes    Birth control/protection: None  Other Topics Concern  . Not on file  Social History Narrative  . Not on file   Social Determinants of Health   Financial Resource Strain:   . Difficulty of Paying Living Expenses: Not on file  Food Insecurity:   . Worried About Programme researcher, broadcasting/film/video in the Last Year: Not on file  . Ran Out of Food in the Last Year: Not on file  Transportation Needs:   . Lack of Transportation (Medical): Not on file  . Lack of Transportation (Non-Medical): Not on file  Physical Activity:   . Days of Exercise per Week: Not on file  . Minutes of Exercise per Session: Not on file  Stress:   . Feeling of Stress : Not on file  Social Connections:   . Frequency of Communication with Friends and Family: Not on file  . Frequency of Social Gatherings with Friends and Family: Not on file  . Attends Religious Services: Not on file  . Active Member of Clubs or Organizations: Not on file  . Attends Banker Meetings: Not on file  . Marital Status: Not on file  Intimate Partner Violence:   . Fear of Current or Ex-Partner: Not on file  . Emotionally Abused: Not on file  . Physically Abused: Not on file  . Sexually Abused: Not on file    Allergies:  Allergies  Allergen Reactions  . Latex Hives    Medications: Prior to Admission medications   Medication Sig Start Date End Date Taking? Authorizing Provider  Prenatal Vit-Fe Fumarate-FA (PRENATAL MULTIVITAMIN) TABS tablet Take 1 tablet by mouth daily.    Yes [provider]  acetaminophen (TYLENOL) 500 MG tablet Take 1,000 mg by mouth every 6 (six)  hours as needed for mild pain. Patient not taking: Reported on 01/10/2020    [provider]  fluticasone (FLONASE) 50 MCG/ACT nasal spray Place 2 sprays into both nostrils daily. Patient not taking: Reported on 01/10/2020 05/18/18   Janalyn Harder, PA-C  ondansetron (ZOFRAN-ODT) 4 MG disintegrating tablet Take by mouth. 12/10/19   [provider]  oxyCODONE-acetaminophen (PERCOCET/ROXICET) 5-325 MG tablet Take 2 tablets by mouth every 6 (six) hours as needed for severe pain. Patient not taking:  Reported on 01/10/2020 09/22/19   Sharyon Cable, CNM  penicillin v potassium (VEETID) 500 MG tablet TAKE 1 TABLET BY MOUTH EVERY 6 HOURS UNTIL FINISHED Patient not taking: Reported on 01/10/2020 04/24/18   [provider]  tamsulosin (FLOMAX) 0.4 MG CAPS capsule Take 1 capsule (0.4 mg total) by mouth daily. Patient not taking: Reported on 01/10/2020 09/23/19   Sharyon Cable, CNM    Physical Exam Vitals: Blood pressure 110/70, height 5\' 3"  (1.6 m), weight 162 lb 6.4 oz (73.7 kg), last menstrual period 07/18/2019, unknown if currently breastfeeding.  General: NAD HEENT: normocephalic, anicteric Pulmonary: No increased work of breathing, CTAB Abdomen: Gravid, soft, non-tender, non-distended.  Umbilicus without lesions.  No hepatomegaly, splenomegaly or masses palpable. No evidence of hernia funal height 25cm and FHT 145 Extremities: no edema, erythema, or tenderness Neurologic: Grossly intact Psychiatric: mood appropriate, affect full   Assessment: 24 y.o. G2P1001 at [redacted]w[redacted]d presenting to initiate prenatal care  Plan: 1) Avoid alcoholic beverages. 2) Patient encouraged not to smoke.  3) Discontinue the use of all non-medicinal drugs and chemicals.  4) Take prenatal vitamins daily.  5) Nutrition, food safety (fish, cheese advisories, and high nitrite foods) and exercise discussed. 6) Hospital and practice style discussed with cross coverage system.  7) Genetic  Screening, quad screen has been obtained 8) Dating [redacted]w[redacted]d and anatomy scan findings reviewed 99) 24 y.o. G2P1001 at [redacted]w[redacted]d with Estimated Date of Delivery: 04/23/20 was seen today in office to discuss trial of labor after cesarean section (TOLAC) versus elective repeat cesarean delivery (ERCD). The following risks were discussed with the patient.  Risk of uterine rupture at term is 0.78 percent with TOLAC and 0.22 percent with ERCD. 1 in 10 uterine ruptures will result in neonatal death or neurological injury. The benefits of a trial of labor after cesarean (TOLAC) resulting in a vaginal birth after cesarean (VBAC) include the following: shorter length of hospital stay and postpartum recovery (in most cases); fewer complications, such as postpartum fever, wound or uterine infection, thromboembolism (blood clots in the leg or lung), need for blood transfusion and fewer neonatal breathing problems. The risks of an attempted VBAC or TOLAC include the following: Risk of failed trial of labor after cesarean (TOLAC) without a vaginal birth after cesarean (VBAC) resulting in repeat cesarean delivery (RCD) in about 20 to 40 percent of women who attempt VBAC.  Her individualized success rate using the MFMU VBAC risk calculator is 57.2%.   Risk of rupture of uterus resulting in an emergency cesarean delivery. The risk of uterine rupture may be related in part to the type of uterine incision made during the first cesarean delivery. A previous transverse uterine incision has the lowest risk of rupture (0.2 to 1.5 percent risk). Vertical or T-shaped uterine incisions have a higher risk of uterine rupture (4 to 9 percent risk)The risk of fetal death is very low with both VBAC and elective repeat cesarean delivery (ERCD), but the likelihood of fetal death is higher with VBAC than with ERCD. Maternal death is very rare with either type of delivery. The risks of an elective repeat cesarean delivery (ERCD) were reviewed with the  patient including but not limited to: 04/998 risk of uterine rupture which could have serious consequences, bleeding which may require transfusion; infection which may require antibiotics; injury to bowel, bladder or other surrounding organs (bowel, bladder, ureters); injury to the fetus; need for additional procedures including hysterectomy in the event of a life-threatening hemorrhage; thromboembolic phenomenon; abnormal  placentation; incisional problems; death and other postoperative or anesthesia complications.    In addition we discussed that our collective office practice is to allow patient's who desire to attempt TOLAC to go into labor naturally.  There is some limited data that rupture rate may increase past [redacted] weeks gestation, but it is reasonable for women who are strongly committed to [facility] to continue pregnancy into the 41st week.  Medical indications necessetating early delivery may arise during the course of any pregnancy.  Given the contraindication on the use of prostaglandins for use in cervical ripening,  recommendation would be to proceed with repeat cesarean for delivery for patient's with unfavorable cervix (low Bishops score) who reach 41 weeks or who otherwise have a medical indication for early delivery.   These risks and benefits are summarized on the consent form, which was reviewed with the patient during the visit.  All her questions answered and she signed a consent indicating a preference for TOLAC/ERCD. A copy of the consent was given to the patient.  MFMU VBAC calculator success rate of 57.2% with 95% CI of 54.7% to 59.6%.  The patient was counseled regarding recommendation for elective repeat cesarean section (ERCS) given that she has failed to go into spontaneous labor by [redacted]w[redacted]d gestation and has an unfavorable cervix.  Previous studies examining have noted an increased risk or maternal and fetal morbidity for patient attempting a trial of labor whose success rated is <70%  compared to [facility], while morbidity was similar for patient attempting a trial of labor vs ERCS with success rates >70. ("Can a prediction model for vaginal birth after cesarean section also predict the probablity of  Morbidity related to a trial of labor?" American Jourcal of Obstetric and Gynecology 2009  January)  Vena Austria, MD, [facility], [facility] 01/10/2020, 11:33 AM

## 2020-01-18 DIAGNOSIS — N200 Calculus of kidney: Secondary | ICD-10-CM | POA: Insufficient documentation

## 2020-01-18 DIAGNOSIS — F419 Anxiety disorder, unspecified: Secondary | ICD-10-CM | POA: Insufficient documentation

## 2020-01-18 DIAGNOSIS — F32A Depression, unspecified: Secondary | ICD-10-CM | POA: Insufficient documentation

## 2020-01-31 ENCOUNTER — Encounter: Payer: Self-pay | Admitting: Obstetrics & Gynecology

## 2020-01-31 ENCOUNTER — Other Ambulatory Visit: Payer: No Typology Code available for payment source

## 2020-01-31 ENCOUNTER — Ambulatory Visit (INDEPENDENT_AMBULATORY_CARE_PROVIDER_SITE_OTHER): Payer: No Typology Code available for payment source | Admitting: Obstetrics & Gynecology

## 2020-01-31 ENCOUNTER — Observation Stay
Admission: EM | Admit: 2020-01-31 | Discharge: 2020-01-31 | Disposition: A | Discharge status: Home / Self Care | Payer: No Typology Code available for payment source | Attending: Obstetrics and Gynecology | Admitting: Obstetrics and Gynecology

## 2020-01-31 ENCOUNTER — Other Ambulatory Visit: Payer: Self-pay

## 2020-01-31 VITALS — BP 120/80 | Wt 160.0 lb

## 2020-01-31 DIAGNOSIS — O34219 Maternal care for unspecified type scar from previous cesarean delivery: Secondary | ICD-10-CM

## 2020-01-31 DIAGNOSIS — O26899 Other specified pregnancy related conditions, unspecified trimester: Secondary | ICD-10-CM | POA: Diagnosis present

## 2020-01-31 DIAGNOSIS — Z3480 Encounter for supervision of other normal pregnancy, unspecified trimester: Secondary | ICD-10-CM

## 2020-01-31 DIAGNOSIS — Z3A28 28 weeks gestation of pregnancy: Secondary | ICD-10-CM

## 2020-01-31 DIAGNOSIS — O26893 Other specified pregnancy related conditions, third trimester: Principal | ICD-10-CM | POA: Insufficient documentation

## 2020-01-31 DIAGNOSIS — N898 Other specified noninflammatory disorders of vagina: Secondary | ICD-10-CM | POA: Diagnosis present

## 2020-01-31 DIAGNOSIS — B373 Candidiasis of vulva and vagina: Secondary | ICD-10-CM | POA: Insufficient documentation

## 2020-01-31 DIAGNOSIS — Z3A38 38 weeks gestation of pregnancy: Secondary | ICD-10-CM | POA: Insufficient documentation

## 2020-01-31 DIAGNOSIS — Z3483 Encounter for supervision of other normal pregnancy, third trimester: Secondary | ICD-10-CM

## 2020-01-31 LAB — WET PREP, GENITAL
Clue Cells Wet Prep HPF POC: NONE SEEN
Sperm: NONE SEEN
Trich, Wet Prep: NONE SEEN
Yeast Wet Prep HPF POC: NONE SEEN

## 2020-01-31 LAB — RUPTURE OF MEMBRANE (ROM)PLUS: Rom Plus: NEGATIVE

## 2020-01-31 LAB — FETAL FIBRONECTIN: Fetal Fibronectin: NEGATIVE

## 2020-01-31 MED ORDER — FLUCONAZOLE 150 MG PO TABS: 150.0000 mg | ORAL_TABLET | ORAL | 0 refills | Status: DC

## 2020-01-31 NOTE — Patient Instructions (Signed)
Third Trimester of Pregnancy The third trimester is from week 28 through week 40 (months 7 through 9). The third trimester is a time when the unborn baby (fetus) is growing rapidly. At the end of the ninth month, the fetus is about 20 inches in length and weighs 6-10 pounds. Body changes during your third trimester Your body will continue to go through many changes during pregnancy. The changes vary from woman to woman. During the third trimester:  Your weight will continue to increase. You can expect to gain 25-35 pounds (11-16 kg) by the end of the pregnancy.  You may begin to get stretch marks on your hips, abdomen, and breasts.  You may urinate more often because the fetus is moving lower into your pelvis and pressing on your bladder.  You may develop or continue to have heartburn. This is caused by increased hormones that slow down muscles in the digestive tract.  You may develop or continue to have constipation because increased hormones slow digestion and cause the muscles that push waste through your intestines to relax.  You may develop hemorrhoids. These are swollen veins (varicose veins) in the rectum that can itch or be painful.  You may develop swollen, bulging veins (varicose veins) in your legs.  You may have increased body aches in the pelvis, back, or thighs. This is due to weight gain and increased hormones that are relaxing your joints.  You may have changes in your hair. These can include thickening of your hair, rapid growth, and changes in texture. Some women also have hair loss during or after pregnancy, or hair that feels dry or thin. Your hair will most likely return to normal after your baby is born.  Your breasts will continue to grow and they will continue to become tender. A yellow fluid (colostrum) may leak from your breasts. This is the first milk you are producing for your baby.  Your belly button may stick out.  You may notice more swelling in your hands,  face, or ankles.  You may have increased tingling or numbness in your hands, arms, and legs. The skin on your belly may also feel numb.  You may feel short of breath because of your expanding uterus.  You may have more problems sleeping. This can be caused by the size of your belly, increased need to urinate, and an increase in your body's metabolism.  You may notice the fetus "dropping," or moving lower in your abdomen (lightening).  You may have increased vaginal discharge.  You may notice your joints feel loose and you may have pain around your pelvic bone. What to expect at prenatal visits You will have prenatal exams every 2 weeks until week 36. Then you will have weekly prenatal exams. During a routine prenatal visit:  You will be weighed to make sure you and the baby are growing normally.  Your blood pressure will be taken.  Your abdomen will be measured to track your baby's growth.  The fetal heartbeat will be listened to.  Any test results from the previous visit will be discussed.  You may have a cervical check near your due date to see if your cervix has softened or thinned (effaced).  You will be tested for Group B streptococcus. This happens between 35 and 37 weeks. Your health care provider may ask you:  What your birth plan is.  How you are feeling.  If you are feeling the baby move.  If you have had any abnormal   symptoms, such as leaking fluid, bleeding, severe headaches, or abdominal cramping.  If you are using any tobacco products, including cigarettes, chewing tobacco, and electronic cigarettes.  If you have any questions. Other tests or screenings that may be performed during your third trimester include:  Blood tests that check for low iron levels (anemia).  Fetal testing to check the health, activity level, and growth of the fetus. Testing is done if you have certain medical conditions or if there are problems during the pregnancy.  Nonstress test  (NST). This test checks the health of your baby to make sure there are no signs of problems, such as the baby not getting enough oxygen. During this test, a belt is placed around your belly. The baby is made to move, and its heart rate is monitored during movement. What is false labor? False labor is a condition in which you feel small, irregular tightenings of the muscles in the womb (contractions) that usually go away with rest, changing position, or drinking water. These are called Braxton Hicks contractions. Contractions may last for hours, days, or even weeks before true labor sets in. If contractions come at regular intervals, become more frequent, increase in intensity, or become painful, you should see your health care provider. What are the signs of labor?  Abdominal cramps.  Regular contractions that start at 10 minutes apart and become stronger and more frequent with time.  Contractions that start on the top of the uterus and spread down to the lower abdomen and back.  Increased pelvic pressure and dull back pain.  A watery or bloody mucus discharge that comes from the vagina.  Leaking of amniotic fluid. This is also known as your "water breaking." It could be a slow trickle or a gush. Let your health care provider know if it has a color or strange odor. If you have any of these signs, call your health care provider right away, even if it is before your due date. Follow these instructions at home: Medicines  Follow your health care provider's instructions regarding medicine use. Specific medicines may be either safe or unsafe to take during pregnancy.  Take a prenatal vitamin that contains at least 600 micrograms (mcg) of folic acid.  If you develop constipation, try taking a stool softener if your health care provider approves. Eating and drinking   Eat a balanced diet that includes fresh fruits and vegetables, whole grains, good sources of protein such as meat, eggs, or tofu,  and low-fat dairy. Your health care provider will help you determine the amount of weight gain that is right for you.  Avoid raw meat and uncooked cheese. These carry germs that can cause birth defects in the baby.  If you have low calcium intake from food, talk to your health care provider about whether you should take a daily calcium supplement.  Eat four or five small meals rather than three large meals a day.  Limit foods that are high in fat and processed sugars, such as fried and sweet foods.  To prevent constipation: ? Drink enough fluid to keep your urine clear or pale yellow. ? Eat foods that are high in fiber, such as fresh fruits and vegetables, whole grains, and beans. Activity  Exercise only as directed by your health care provider. Most women can continue their usual exercise routine during pregnancy. Try to exercise for 30 minutes at least 5 days a week. Stop exercising if you experience uterine contractions.  Avoid heavy lifting.  Do   not exercise in extreme heat or humidity, or at high altitudes.  Wear low-heel, comfortable shoes.  Practice good posture.  You may continue to have sex unless your health care provider tells you otherwise. Relieving pain and discomfort  Take frequent breaks and rest with your legs elevated if you have leg cramps or low back pain.  Take warm sitz baths to soothe any pain or discomfort caused by hemorrhoids. Use hemorrhoid cream if your health care provider approves.  Wear a good support bra to prevent discomfort from breast tenderness.  If you develop varicose veins: ? Wear support pantyhose or compression stockings as told by your healthcare provider. ? Elevate your feet for 15 minutes, 3-4 times a day. Prenatal care  Write down your questions. Take them to your prenatal visits.  Keep all your prenatal visits as told by your health care provider. This is important. Safety  Wear your seat belt at all times when driving.  Make  a list of emergency phone numbers, including numbers for family, friends, the hospital, and police and fire departments. General instructions  Avoid cat litter boxes and soil used by cats. These carry germs that can cause birth defects in the baby. If you have a cat, ask someone to clean the litter box for you.  Do not travel far distances unless it is absolutely necessary and only with the approval of your health care provider.  Do not use hot tubs, steam rooms, or saunas.  Do not drink alcohol.  Do not use any products that contain nicotine or tobacco, such as cigarettes and e-cigarettes. If you need help quitting, ask your health care provider.  Do not use any medicinal herbs or unprescribed drugs. These chemicals affect the formation and growth of the baby.  Do not douche or use tampons or scented sanitary pads.  Do not cross your legs for long periods of time.  To prepare for the arrival of your baby: ? Take prenatal classes to understand, practice, and ask questions about labor and delivery. ? Make a trial run to the hospital. ? Visit the hospital and tour the maternity area. ? Arrange for maternity or paternity leave through employers. ? Arrange for family and friends to take care of pets while you are in the hospital. ? Purchase a rear-facing car seat and make sure you know how to install it in your car. ? Pack your hospital bag. ? Prepare the baby's nursery. Make sure to remove all pillows and stuffed animals from the baby's crib to prevent suffocation.  Visit your dentist if you have not gone during your pregnancy. Use a soft toothbrush to brush your teeth and be gentle when you floss. Contact a health care provider if:  You are unsure if you are in labor or if your water has broken.  You become dizzy.  You have mild pelvic cramps, pelvic pressure, or nagging pain in your abdominal area.  You have lower back pain.  You have persistent nausea, vomiting, or  diarrhea.  You have an unusual or bad smelling vaginal discharge.  You have pain when you urinate. Get help right away if:  Your water breaks before 37 weeks.  You have regular contractions less than 5 minutes apart before 37 weeks.  You have a fever.  You are leaking fluid from your vagina.  You have spotting or bleeding from your vagina.  You have severe abdominal pain or cramping.  You have rapid weight loss or weight gain.  You have   shortness of breath with chest pain.  You notice sudden or extreme swelling of your face, hands, ankles, feet, or legs.  Your baby makes fewer than 10 movements in 2 hours.  You have severe headaches that do not go away when you take medicine.  You have vision changes. Summary  The third trimester is from week 28 through week 40, months 7 through 9. The third trimester is a time when the unborn baby (fetus) is growing rapidly.  During the third trimester, your discomfort may increase as you and your baby continue to gain weight. You may have abdominal, leg, and back pain, sleeping problems, and an increased need to urinate.  During the third trimester your breasts will keep growing and they will continue to become tender. A yellow fluid (colostrum) may leak from your breasts. This is the first milk you are producing for your baby.  False labor is a condition in which you feel small, irregular tightenings of the muscles in the womb (contractions) that eventually go away. These are called Braxton Hicks contractions. Contractions may last for hours, days, or even weeks before true labor sets in.  Signs of labor can include: abdominal cramps; regular contractions that start at 10 minutes apart and become stronger and more frequent with time; watery or bloody mucus discharge that comes from the vagina; increased pelvic pressure and dull back pain; and leaking of amniotic fluid. This information is not intended to replace advice given to you by your  health care provider. Make sure you discuss any questions you have with your health care provider. Document Revised: 07/05/2018 Document Reviewed: 04/19/2016 Elsevier Patient Education  2020 [employer]  

## 2020-01-31 NOTE — Discharge Summary (Signed)
Physician Discharge Summary   Patient ID: Jordan Wallace [id number] 24 y.o. 05/09/1995  Admit date: 01/31/2020  Discharge date and time: No discharge date for patient encounter.   Admitting Physician: Natale Milch, MD   Discharge Physician: Adelene Idler MD  Admission Diagnoses: Vaginal discharge during pregnancy [O26.899, N89.8]  Discharge Diagnoses: Yeast vaginitis  Admission Condition: good  Discharged Condition: good  Indication for Admission: Evaluation of vaginal discharge  Hospital Course:   Patient presents to labor and delivery with complaints of loss of her mucous plug.  She reports that when she went to the bathroom at 6 PM and wipe she noticed that she had large amount of mucus and a small amount of blood.  She reports that she has been having leakage of fluid over the last week.  She reports that Nestor Lewandowsky has been wearing it all times but she had not really thought much about it.  She has not previously been seen or treated for this problem. She denies contractions.  Speculum exam showed a large amount of white clumpy vaginal discharge ans mucus.  No pooling of fluid.  Cervix was difficult to visualize and was very posterior.  Gentle vaginal exam was attempted although the cervix was very far posterior not able to be reached.  Bedside AFI 9.4 cm baby cephalic.  Good fetal movement and fetal breathing noted on ultrasound.  FFN negative, ROM plus negative  Will treat for yeast infection. Follow up as planned in the office. In 2 weeks.   NST: 150 bpm baseline, moderate variability, 10x10 accelerations, no decelerations. Tocometer : irregular irritability  Consults: None  Significant Diagnostic Studies: labs: SEE EPIC  Treatments: outpatient diflucan  Discharge Exam: BP 123/66   Pulse 77   Temp 99 F (37.2 C) (Oral)   Ht 5\' 3"  (1.6 m)   Wt 72.6 kg   LMP 07/18/2019   BMI 28.34 kg/m   General Appearance:    Alert, cooperative, no  distress, appears stated age  Head:    Normocephalic, without obvious abnormality, atraumatic  Eyes:    PERRL, conjunctiva/corneas clear, EOM's intact, fundi    benign, both eyes  Ears:    Normal TM's and external ear canals, both ears  Nose:   Nares normal, septum midline, mucosa normal, no drainage    or sinus tenderness  Throat:   Lips, mucosa, and tongue normal; teeth and gums normal  Neck:   Supple, symmetrical, trachea midline, no adenopathy;    thyroid:  no enlargement/tenderness/nodules; no carotid   bruit or JVD  Back:     Symmetric, no curvature, ROM normal, no CVA tenderness  Lungs:     Clear to auscultation bilaterally, respirations unlabored  Chest Wall:    No tenderness or deformity   Heart:    Regular rate and rhythm, S1 and S2 normal, no murmur, rub   or gallop  Breast Exam:    No tenderness, masses, or nipple abnormality  Abdomen:     Soft, non-tender, bowel sounds active all four quadrants,    no masses, no organomegaly  Genitalia:    See above  Rectal:    Normal tone, normal prostate, no masses or tenderness;   guaiac negative stool  Extremities:   Extremities normal, atraumatic, no cyanosis or edema  Pulses:   2+ and symmetric all extremities  Skin:   Skin color, texture, turgor normal, no rashes or lesions  Lymph nodes:   Cervical, supraclavicular, and axillary nodes normal  Neurologic:  CNII-XII intact, normal strength, sensation and reflexes    throughout    Disposition:  Discharge disposition: 01-Home or Self Care       Patient Instructions:  Allergies as of 01/31/2020      Reactions   Latex Hives      Medication List    TAKE these medications   acetaminophen 500 MG tablet Commonly known as: TYLENOL Take 1,000 mg by mouth every 6 (six) hours as needed for mild pain.   fluconazole 150 MG tablet Commonly known as: DIFLUCAN Take 1 tablet (150 mg total) by mouth every 3 (three) days for 2 doses.   fluticasone 50 MCG/ACT nasal spray Commonly  known as: FLONASE Place 2 sprays into both nostrils daily.   ondansetron 4 MG disintegrating tablet Commonly known as: ZOFRAN-ODT Take by mouth.   oxyCODONE-acetaminophen 5-325 MG tablet Commonly known as: PERCOCET/ROXICET Take 2 tablets by mouth every 6 (six) hours as needed for severe pain.   penicillin v potassium 500 MG tablet Commonly known as: VEETID TAKE 1 TABLET BY MOUTH EVERY 6 HOURS UNTIL FINISHED   prenatal multivitamin Tabs tablet Take 1 tablet by mouth daily.   tamsulosin 0.4 MG Caps capsule Commonly known as: FLOMAX Take 1 capsule (0.4 mg total) by mouth daily.      Activity: activity as tolerated Diet: regular diet Wound Care: none needed  Follow-up with [facility] in 2 weeks.  Signed: Natale Milch 01/31/2020 9:31 PM

## 2020-01-31 NOTE — Progress Notes (Signed)
  Subjective  Fetal Movement? yes Contractions? no Leaking Fluid? no Vaginal Bleeding? no TRANSFER of CARE from [facility] in [place] Prior CS, desires repeat after discussions  Objective  BP 120/80   Wt 160 lb (72.6 kg)   LMP 07/18/2019   BMI 28.34 kg/m  General: NAD Pumonary: no increased work of breathing Abdomen: gravid, non-tender Extremities: no edema Psychiatric: mood appropriate, affect full  Assessment  24 y.o. G2P1001 at [redacted]w[redacted]d by  04/23/2020, by Last Menstrual Period presenting for routine prenatal visit  Plan   Problem List Items Addressed This Visit      Other   History of cesarean delivery, antepartum    Desires repear CS after lengthy discussion again as to the pros and cons of VBAC and of CS    Sch for 39 weeks (04/16/20)   Supervision of other normal pregnancy, antepartum   [redacted] weeks gestation of pregnancy         PN[facility], [facility]     Glucola today      June 2021 Problems (from 09/22/19 to present)    Problem Noted Resolved   Supervision of other normal pregnancy, antepartum     Overview Signed 01/18/2020 10:17 AM by Vena Austria, MD    [facility] ([facility] [employer]) Prenatal Labs  Dating LMP=9 week [place] Blood type: O pos  Genetic Screen Quad:negative 11/29/2019 Antibody: negative  Anatomic [place] Normal female (outside scan) Rubella: Immune Varicella: @VZVIGG @  GTT Third trimester:  RPR: negative  Rhogam N/A HBsAg: negative  TDaP vaccine                       Flu Shot: HIV: negative  Baby Food                                GBS: p  Contraception  Pap: 08/30/2017 NILM  CBB     CS/VBAC 2016 C-section FTP 8lbs 15oz Desires repeat   Support Person 2017           History of cesarean delivery, antepartum         Mason Jim, MD, [facility], [facility] 01/31/2020  10:08 AM

## 2020-01-31 NOTE — OB Triage Note (Signed)
24 yo G2P1, pt stated she went to the restroom around 6p. As she wiped she noticed a plug of mucus, and a small line of blood. Pt reports LOF over the past week. The lining of her underwear is wet at times. No other reports of bleeding. Normal amount of daily morning sickness, vomiting most mornings. Pt was seen in the office today, but forgot to mention the LOF over the past week to the provider.

## 2020-02-01 LAB — 28 WEEK RH+PANEL
Basophils Absolute: 0 10*3/uL (ref 0.0–0.2)
Basos: 0 %
EOS (ABSOLUTE): 0.1 10*3/uL (ref 0.0–0.4)
Eos: 1 %
Gestational Diabetes Screen: 143 mg/dL — ABNORMAL HIGH (ref 65–139)
HIV Screen 4th Generation wRfx: NONREACTIVE
Hematocrit: 32.5 % — ABNORMAL LOW (ref 34.0–46.6)
Hemoglobin: 11.2 g/dL (ref 11.1–15.9)
Immature Grans (Abs): 0.1 10*3/uL (ref 0.0–0.1)
Immature Granulocytes: 1 %
Lymphocytes Absolute: 2.1 10*3/uL (ref 0.7–3.1)
Lymphs: 19 %
MCH: 31.9 pg (ref 26.6–33.0)
MCHC: 34.5 g/dL (ref 31.5–35.7)
MCV: 93 fL (ref 79–97)
Monocytes Absolute: 0.5 10*3/uL (ref 0.1–0.9)
Monocytes: 5 %
Neutrophils Absolute: 8.1 10*3/uL — ABNORMAL HIGH (ref 1.4–7.0)
Neutrophils: 74 %
Platelets: 140 10*3/uL — ABNORMAL LOW (ref 150–450)
RBC: 3.51 x10E6/uL — ABNORMAL LOW (ref 3.77–5.28)
RDW: 12 % (ref 11.7–15.4)
RPR Ser Ql: NONREACTIVE
WBC: 10.9 10*3/uL — ABNORMAL HIGH (ref 3.4–10.8)

## 2020-02-03 ENCOUNTER — Other Ambulatory Visit: Payer: Self-pay | Admitting: Obstetrics and Gynecology

## 2020-02-03 ENCOUNTER — Telehealth: Payer: Self-pay

## 2020-02-03 DIAGNOSIS — Z3480 Encounter for supervision of other normal pregnancy, unspecified trimester: Secondary | ICD-10-CM

## 2020-02-03 DIAGNOSIS — Z3A28 28 weeks gestation of pregnancy: Secondary | ICD-10-CM

## 2020-02-03 DIAGNOSIS — O34219 Maternal care for unspecified type scar from previous cesarean delivery: Secondary | ICD-10-CM

## 2020-02-03 DIAGNOSIS — O99810 Abnormal glucose complicating pregnancy: Secondary | ICD-10-CM | POA: Insufficient documentation

## 2020-02-03 MED ORDER — FLUCONAZOLE 150 MG PO TABS: 150.0000 mg | ORAL_TABLET | ORAL | 0 refills | Status: DC

## 2020-02-03 NOTE — Telephone Encounter (Signed)
Pt aware.

## 2020-02-03 NOTE — Telephone Encounter (Signed)
Sent!

## 2020-02-03 NOTE — Telephone Encounter (Signed)
Pt needs one more diflucan sent in to the [facility] pharmacy, she accidentally threw the second one away.

## 2020-02-04 ENCOUNTER — Other Ambulatory Visit: Payer: Self-pay | Admitting: Obstetrics and Gynecology

## 2020-02-04 MED ORDER — FLUCONAZOLE 150 MG PO TABS: 150.0000 mg | ORAL_TABLET | ORAL | 0 refills | Status: DC

## 2020-02-06 ENCOUNTER — Other Ambulatory Visit: Payer: Self-pay

## 2020-02-06 ENCOUNTER — Other Ambulatory Visit: Payer: No Typology Code available for payment source

## 2020-02-06 DIAGNOSIS — Z3A28 28 weeks gestation of pregnancy: Secondary | ICD-10-CM

## 2020-02-06 DIAGNOSIS — Z3480 Encounter for supervision of other normal pregnancy, unspecified trimester: Secondary | ICD-10-CM

## 2020-02-06 DIAGNOSIS — O99810 Abnormal glucose complicating pregnancy: Secondary | ICD-10-CM

## 2020-02-07 LAB — GESTATIONAL GLUCOSE TOLERANCE
Glucose, Fasting: 83 mg/dL (ref 65–94)
Glucose, GTT - 1 Hour: 177 mg/dL (ref 65–179)
Glucose, GTT - 2 Hour: 164 mg/dL — ABNORMAL HIGH (ref 65–154)
Glucose, GTT - 3 Hour: 125 mg/dL (ref 65–139)

## 2020-02-10 ENCOUNTER — Telehealth: Payer: Self-pay

## 2020-02-10 ENCOUNTER — Other Ambulatory Visit: Payer: Self-pay | Admitting: Obstetrics and Gynecology

## 2020-02-10 MED ORDER — DOXYLAMINE-PYRIDOXINE 10-10 MG PO TBEC: 2.0000 | DELAYED_RELEASE_TABLET | Freq: Every day | ORAL | 5 refills | Status: DC

## 2020-02-10 MED ORDER — ONDANSETRON 4 MG PO TBDP: 4.0000 mg | ORAL_TABLET | Freq: Three times a day (TID) | ORAL | 0 refills | Status: DC | PRN

## 2020-02-10 NOTE — Telephone Encounter (Signed)
Patient aware.

## 2020-02-10 NOTE — Telephone Encounter (Signed)
Patient requesting zofran and also Diclegis (& combination of something else) as discussed at visit. Cb#[phone]

## 2020-02-10 NOTE — Telephone Encounter (Signed)
I've called in the Zofran and diclegis to [facility] pharmacy

## 2020-02-11 ENCOUNTER — Telehealth: Payer: Self-pay

## 2020-02-11 ENCOUNTER — Encounter: Payer: Self-pay | Admitting: Obstetrics & Gynecology

## 2020-02-11 ENCOUNTER — Other Ambulatory Visit: Payer: Self-pay

## 2020-02-11 ENCOUNTER — Telehealth: Payer: Self-pay | Admitting: Obstetrics & Gynecology

## 2020-02-11 ENCOUNTER — Observation Stay
Admission: EM | Admit: 2020-02-11 | Discharge: 2020-02-11 | Disposition: A | Discharge status: Home / Self Care | Payer: No Typology Code available for payment source | Attending: Obstetrics & Gynecology | Admitting: Obstetrics & Gynecology

## 2020-02-11 DIAGNOSIS — O218 Other vomiting complicating pregnancy: Secondary | ICD-10-CM | POA: Diagnosis not present

## 2020-02-11 DIAGNOSIS — O219 Vomiting of pregnancy, unspecified: Secondary | ICD-10-CM | POA: Diagnosis present

## 2020-02-11 DIAGNOSIS — Z3A29 29 weeks gestation of pregnancy: Secondary | ICD-10-CM | POA: Insufficient documentation

## 2020-02-11 DIAGNOSIS — U071 COVID-19: Secondary | ICD-10-CM | POA: Insufficient documentation

## 2020-02-11 DIAGNOSIS — O98513 Other viral diseases complicating pregnancy, third trimester: Secondary | ICD-10-CM | POA: Diagnosis not present

## 2020-02-11 DIAGNOSIS — O34219 Maternal care for unspecified type scar from previous cesarean delivery: Secondary | ICD-10-CM | POA: Diagnosis present

## 2020-02-11 DIAGNOSIS — Z3480 Encounter for supervision of other normal pregnancy, unspecified trimester: Secondary | ICD-10-CM

## 2020-02-11 LAB — BASIC METABOLIC PANEL
Anion gap: 13 (ref 5–15)
BUN: 6 mg/dL (ref 6–20)
CO2: 16 mmol/L — ABNORMAL LOW (ref 22–32)
Calcium: 8.8 mg/dL — ABNORMAL LOW (ref 8.9–10.3)
Chloride: 105 mmol/L (ref 98–111)
Creatinine, Ser: 0.59 mg/dL (ref 0.44–1.00)
GFR, Estimated: >60 mL/min (ref >60)
Glucose, Bld: 85 mg/dL (ref 70–99)
Potassium: 3.3 mmol/L — ABNORMAL LOW (ref 3.5–5.1)
Sodium: 134 mmol/L — ABNORMAL LOW (ref 135–145)

## 2020-02-11 LAB — CBC
HCT: 33 % — ABNORMAL LOW (ref 36.0–46.0)
Hemoglobin: 11.3 g/dL — ABNORMAL LOW (ref 12.0–15.0)
MCH: 31.5 pg (ref 26.0–34.0)
MCHC: 34.2 g/dL (ref 30.0–36.0)
MCV: 91.9 fL (ref 80.0–100.0)
Platelets: 128 10*3/uL — ABNORMAL LOW (ref 150–400)
RBC: 3.59 MIL/uL — ABNORMAL LOW (ref 3.87–5.11)
RDW: 12.7 % (ref 11.5–15.5)
WBC: 4.9 10*3/uL (ref 4.0–10.5)
nRBC: 0 % (ref 0.0–0.2)

## 2020-02-11 LAB — RESPIRATORY PANEL BY RT PCR (FLU A&B, COVID)
Influenza A by PCR: NEGATIVE
Influenza B by PCR: NEGATIVE
SARS Coronavirus 2 by RT PCR: POSITIVE — AB

## 2020-02-11 LAB — FETAL FIBRONECTIN: Fetal Fibronectin: NEGATIVE

## 2020-02-11 MED ORDER — METHYLPREDNISOLONE SODIUM SUCC 125 MG IJ SOLR
125.0000 mg | Freq: Once | INTRAMUSCULAR | Status: DC | PRN
  Filled 2020-02-11: qty 2

## 2020-02-11 MED ORDER — ALBUTEROL SULFATE HFA 108 (90 BASE) MCG/ACT IN AERS
2.0000 | INHALATION_SPRAY | Freq: Once | RESPIRATORY_TRACT | Status: DC | PRN
  Filled 2020-02-11: qty 6.7

## 2020-02-11 MED ORDER — LIDOCAINE HCL (PF) 1 % IJ SOLN: 30.0000 mL | INTRAMUSCULAR | Status: DC | PRN

## 2020-02-11 MED ORDER — ACETAMINOPHEN 325 MG PO TABS
650.0000 mg | ORAL_TABLET | ORAL | Status: DC | PRN
  Administered 2020-02-11: 650 mg via ORAL
  Filled 2020-02-11: qty 2

## 2020-02-11 MED ORDER — SODIUM CHLORIDE 0.9 % IV SOLN
1200.0000 mg | Freq: Once | INTRAVENOUS | Status: AC
  Administered 2020-02-11: 1200 mg via INTRAVENOUS
  Filled 2020-02-11: qty 10

## 2020-02-11 MED ORDER — ONDANSETRON HCL 4 MG/2ML IJ SOLN
4.0000 mg | Freq: Four times a day (QID) | INTRAMUSCULAR | Status: DC | PRN
  Administered 2020-02-11: 4 mg via INTRAVENOUS
  Filled 2020-02-11: qty 2

## 2020-02-11 MED ORDER — SODIUM CHLORIDE 0.9 % IV SOLN: INTRAVENOUS | Status: DC | PRN

## 2020-02-11 MED ORDER — FAMOTIDINE IN NACL 20-0.9 MG/50ML-% IV SOLN
20.0000 mg | Freq: Once | INTRAVENOUS | Status: DC | PRN
  Filled 2020-02-11: qty 50

## 2020-02-11 MED ORDER — DIPHENHYDRAMINE HCL 50 MG/ML IJ SOLN: 50.0000 mg | Freq: Once | INTRAMUSCULAR | Status: DC | PRN

## 2020-02-11 MED ORDER — SODIUM CHLORIDE 0.9 % IV SOLN: Freq: Once | INTRAVENOUS | Status: DC

## 2020-02-11 MED ORDER — FAMOTIDINE IN NACL 20-0.9 MG/50ML-% IV SOLN
20.0000 mg | Freq: Two times a day (BID) | INTRAVENOUS | Status: DC
  Administered 2020-02-11: 20 mg via INTRAVENOUS
  Filled 2020-02-11: qty 50

## 2020-02-11 MED ORDER — EPINEPHRINE 0.3 MG/0.3ML IJ SOAJ
0.3000 mg | Freq: Once | INTRAMUSCULAR | Status: DC | PRN
  Filled 2020-02-11: qty 0.6

## 2020-02-11 MED ORDER — LACTATED RINGERS IV SOLN: INTRAVENOUS | Status: DC

## 2020-02-11 NOTE — OB Triage Note (Signed)
Patient is G2P1, 97weeks5days. Reports symptoms of cough, congestion, fever (highest 99.8), chills and muscle weakness worsening over the past week. Patient took Robitussin at home and it did not help symptoms. Patient reports nausea and vomiting the past 2 days and is unable to keep anything down. Contractions started 0400, 8-10 mins apart lasting about 40 seconds at home per patient. Ctx pain 6 on a pain scale of 0-10. Denies bleeding, LOF. FM+. VSS.

## 2020-02-11 NOTE — Discharge Summary (Signed)
Physician Discharge Summary  Patient ID: Jordan Wallace MRN: [id number] DOB/AGE: 1995-07-28 24 y.o.  Admit date: 02/11/2020 Discharge date: 02/11/2020  Admission Diagnoses:Principal Problem:   COVID-19 affecting pregnancy in third trimester   History of cesarean delivery, antepartum   Nausea/vomiting in pregnancy  Discharge Diagnoses:  Principal Problem:   COVID-19 affecting pregnancy in third trimester Active Problems:   History of cesarean delivery, antepartum   Nausea/vomiting in pregnancy   Discharged Condition: good  Hospital Course: Pt presented with mild congesittion sx's, nausea snd vomiting, and crampy abdpoinal pains.  She ruled out for preterm labor.  Mildly dilated cervix, and neg f FN test, rare ctx on momitor.  She tested pos for Covid19, and received MAb therapy.  She is treated w IVF, and Zofran.  She has meds for nausea at home. She will go home ot be quarantined and manageged at home.    Consults: None  Significant Diagnostic Studies: +FHTs; Results for orders placed or performed during the hospital encounter of 02/11/20  Respiratory Panel by RT PCR (Flu A&B, Covid) - Nasopharyngeal Swab   Specimen: Nasopharyngeal Swab  Result Value Ref Range   SARS Coronavirus 2 by RT PCR POSITIVE (A) NEGATIVE   Influenza A by PCR NEGATIVE NEGATIVE   Influenza B by PCR NEGATIVE NEGATIVE  Basic metabolic panel  Result Value Ref Range   Sodium 134 (L) 135 - 145 mmol/L   Potassium 3.3 (L) 3.5 - 5.1 mmol/L   Chloride 105 98 - 111 mmol/L   CO2 16 (L) 22 - 32 mmol/L   Glucose, Bld 85 70 - 99 mg/dL   BUN 6 6 - 20 mg/dL   Creatinine, Ser 3.97 0.44 - 1.00 mg/dL   Calcium 8.8 (L) 8.9 - 10.3 mg/dL   GFR, Estimated >67 >34 mL/min   Anion gap 13 5 - 15  CBC  Result Value Ref Range   WBC 4.9 4.0 - 10.5 K/uL   RBC 3.59 (L) 3.87 - 5.11 MIL/uL   Hemoglobin 11.3 (L) 12.0 - 15.0 g/dL   HCT 19.3 (L) 36 - 46 %   MCV 91.9 80.0 - 100.0 fL   MCH 31.5 26.0 - 34.0 pg   MCHC 34.2 30.0  - 36.0 g/dL   RDW 79.0 24.0 - 97.3 %   Platelets 128 (L) 150 - 400 K/uL   nRBC 0.0 0.0 - 0.2 %  Fetal fibronectin  Result Value Ref Range   Fetal Fibronectin NEGATIVE NEGATIVE     Treatments: MAb infusion  Discharge Exam: Blood pressure (!) 107/57, pulse 93, temperature 98.3 F (36.8 C), temperature source Oral, resp. rate 16, last menstrual period 07/18/2019, SpO2 99 %, unknown if currently breastfeeding. no change  Disposition: Discharge disposition: 01-Home or Self Care       Discharge Instructions    Call MD for:   Complete by: As directed    Worsening contractions or pain; leakage of fluid; bleeding.   Diet - low sodium heart healthy   Complete by: As directed    Increase activity slowly   Complete by: As directed      Allergies as of 02/11/2020      Reactions   Latex Hives      Medication List    TAKE these medications   Doxylamine-Pyridoxine 10-10 MG Tbec Commonly known as: Diclegis Take 2 tablets by mouth at bedtime. If symptoms persist, add one tablet in the morning and one in the afternoon   ondansetron 4 MG disintegrating tablet Commonly known as:  ZOFRAN-ODT Take 1 tablet (4 mg total) by mouth every 8 (eight) hours as needed for nausea or vomiting.   prenatal multivitamin Tabs tablet Take 1 tablet by mouth daily.       Follow-up Information    Nadara Mustard, MD. Go in 2 week(s).   Specialty: Obstetrics and Gynecology Why: on Dec 1 Contact information: [address] [place] [address] [phone]               Signed: Letitia Libra 02/11/2020, 4:28 PM

## 2020-02-11 NOTE — Telephone Encounter (Signed)
-----   Message from Nadara Mustard, MD sent at 02/11/2020  1:58 PM EST ----- Regarding: re schedule appt Due to covid pos test today, change appt from next week to after Nov 30 Pt aware you will call

## 2020-02-11 NOTE — Discharge Instructions (Signed)
Pregnancy and COVID-19 Coronavirus disease, also called COVID-19, is an infection of the lungs and airways (respiratory tract). It is unclear at this time if pregnancy makes it more likely for you to get COVID-19, or what effects the infection may have on your unborn baby. However, pregnancy causes changes to your heart, lungs, and your body's disease-fighting system (immune system). Some of these changes make it more likely for you to get sick and have more serious illness. Therefore, it is important for you to take precautions in order to protect yourself and your unborn baby. There have been studies showing that obesity and diabetes may put you at higher risk for serious illness. If you are pregnant and are obese or have diabetes, you should take extra precautions to protect yourself from the virus. Work with your health care team to develop a plan to protect yourself from all infections, including COVID-19. This is one way for you to stay healthy during your pregnancy and to keep your baby healthy as well. How does this affect me? If you get COVID-19, there is a risk that you may:  Get a respiratory illness that can lead to pneumonia.  Give birth to your baby before 37 weeks of pregnancy (premature birth). If you have or may have COVID-19, your health care provider may recommend special precautions around your pregnancy. This may affect how you:  Receive care before delivery (prenatal care). How you visit your health care provider may change. Tests and scans may need to be performed differently.  Receive care during labor and delivery. This may affect your birth plan, including who may be with you during labor and delivery.  Receive care after you deliver your baby (postpartum care). You may stay longer in the hospital and in a special room.  Feed your baby after he or she is born. Pregnancy can be an especially stressful time because of the changes in your body and the preparation involved in  becoming a parent. In addition, you may be feeling especially fearful, anxious, or stressed because of COVID-19 and how it is affecting you. How does this affect my baby? It is not known whether a mother will transmit the virus to her unborn baby. There is a risk that if you get COVID-19:  The virus that causes COVID-19 can pass to your baby.  You may have premature birth. Your baby may require more medical care if this happens. What can I do to lower my risk?  There is no vaccine to help prevent COVID-19. However, there are actions that you can take to protect yourself and others from this virus. Cleaning and personal hygiene  Wash your hands often with soap and water for at least 20 seconds. If soap and water are not available, use alcohol-based hand sanitizer.  Avoid touching your mouth, face, eyes, or nose.  Clean and disinfect objects and surfaces that are frequently touched every day. These may include: ? Counters and tables. ? Doorknobs and light switches. ? Sinks and faucets. ? Electronics such as phones, remote controls, keyboards, computers, and tablets. Stay away from others  Stay away from people who are sick, if possible.  Avoid social gatherings and travel.  Stay home as much as possible. Follow these instructions: Breastfeeding It is not known if the virus that causes COVID-19 can pass through breast milk to your baby. You should make a plan for feeding your infant with your family and your health care team. If you have or may have COVID-19,  your health care provider may recommend that you take precautions while breastfeeding, such as:  Washing your hands before feeding your baby.  Wearing a mask while feeding your baby.  Pumping or expressing breast milk to feed to your baby. If possible, ask someone in your household who is not sick to feed your baby the expressed breast milk. ? Wash your hands before touching pump parts. ? Wash and disinfect all pump parts  after expressing milk. Follow the manufacturer's instructions to clean and disinfect all pump parts. General instructions  If you think you have a COVID-19 infection, contact your health care provider right away. Tell your health care provider that you think you may have a COVID-19 infection.  Follow your health care provider's instructions on taking medicines. Some medicines may be unsafe to take during pregnancy.  Cover your mouth and nose by wearing a mask or other cloth covering over your face when you go out in public.  Find ways to manage stress. These may include: ? Using relaxation techniques like meditation and deep breathing. ? Getting regular exercise. Most women can continue their usual exercise routine during pregnancy. Ask your health care provider what activities are safe for you. ? Seeking support from family, friends, or spiritual resources. If you cannot be together in person, you can still connect by phone calls, texts, video calls, or online messaging. ? Spending time doing relaxing activities that you enjoy, like listening to music or reading a good book.  Ask for help if you have counseling or nutritional needs during pregnancy. Your health care provider can offer advice or refer you to resources or specialists who can help you with various needs.  Keep all follow-up visits as told by your health care provider. This is important. Where to find more information Centers for Disease Control and Prevention (CDC): http://gutierrez-robinson.com/ World Health Organization [facility]): https://www.morales.com/ [employer]necologists (ACOG): StickerEmporium.tn Questions to ask your health care team  What should I do if I have COVID-19 symptoms?  How will COVID-19 affect my prenatal care visits, tests and scans, labor and  delivery, and postpartum care?  Should I plan to breastfeed my baby?  Where can I find mental health resources?  Where can I find support if I have financial concerns? Contact a health care provider if:  You have signs and symptoms of infection, including a fever or cough. Tell your health care team that you think you may have a COVID-19 infection.  You have strong emotions, such as sadness or anxiety.  You feel unsafe in your home and need help finding a safe place to live.  You have bloody or watery vaginal discharge or vaginal bleeding. Get help right away if:  You have signs or symptoms of labor before 37 weeks of pregnancy. These include: ? Contractions that are 5 minutes or less apart, or that increase in frequency, intensity, or length. ? Sudden, sharp pain in the abdomen or in the lower back. ? A gush or trickle of fluid from your vagina.  You have signs of more serious illness such as: ? You have difficulty breathing. ? You have chest pain. ? You have a fever greater than 102F (39C) or higher that does not go away. ? You cannot drink fluids without vomiting. ? You feel extremely weak or you faint. These symptoms may represent a serious problem that is an emergency. Do not wait to see if the symptoms will go away. Get medical help right away. Call  your local emergency services (911 in the [place] Do not drive yourself to the hospital. Summary  Coronavirus disease, also called COVID-19, is an infection of the lungs and airways (respiratory tract). It is unclear at this time if pregnancy makes you more susceptible to COVID-19 and what effects it may have on unborn babies.  It is important to take precautions to protect yourself and your developing baby. This includes washing your hands often, avoiding touching your mouth, face, eyes, or nose, avoiding social gatherings and travel, and staying away from people who are sick.  If you think you have a COVID-19 infection,  contact your health care provider right away. Tell your health care provider that you think you may have a COVID-19 infection.  If you have or may have COVID-19, your health care provider may recommend special precautions during your pregnancy, labor and delivery, and after your baby is born. This information is not intended to replace advice given to you by your health care provider. Make sure you discuss any questions you have with your health care provider. Document Revised: 01/04/2019 Document Reviewed: 07/10/2018 Elsevier Patient Education  2020 Elsevier Inc.   

## 2020-02-11 NOTE — OB Triage Note (Signed)
Patient Discharged home per provider. Pt educated about when to call her provider or return to the ED. Pt instructed to keep all follow up in 2 weeks with her OBGYN. AVS given to patient and RN answered all questions and patient has no further questions at this time. Pt discharged home in stable condition with significant other.

## 2020-02-11 NOTE — Consult Note (Signed)
Pharmacy COVID-19 Monoclonal Antibody Screening  Mickie Badders was identified as being not hospitalized with symptoms from Covid-19 on admission but an incidental positive PCR has been documented.  The patient may qualify for the use of monoclonal antibodies (mAB) for COVID-19 viral infection to prevent worsening symptoms stemming from Covid-19 infection.  The patient was identified based on a positive COVID-19 PCR and not requiring the use of supplemental oxygen at this time.  This patient meets the FDA criteria for Emergency Use Authorization of casirivimab/imdevimab or bamlanivimab/etesevimab.  Has a (+) direct SARS-CoV-2 viral test result  Is NOT hospitalized due to COVID-19  Is within 10 days of symptom onset  Has at least one of the high risk factor(s) for progression to severe COVID-19 and/or hospitalization as defined in EUA.  Specific high risk criteria : Pregnancy  Additionally: The patient has not had a positive COVID-19 PCR in the last 90 days.  The patient is unvaccinated against COVID-19.  Since the patient is unvaccinated and meets high risk criteria, the patient is eligible for mAB administration.   This eligibility and indication for treatment was discussed with the patient's physician: Yes  Plan: Based on the above discussion, it was decided that the patient will receive one dose of the available COVID-19 mAB combination. Pharmacy will coordinate administration timing with patient's nurse. Recommended infusion monitoring parameters communicated to the nursing team.   Ronnald Ramp, PharmD, BCPS 02/11/2020  2:30 PM

## 2020-02-11 NOTE — Progress Notes (Signed)
Monoclonal therapy completed at 1621 and patient vitals stable. Pt denies any adverse reactions. Final vitals will be taken at 1730 and patient to discharge home afterward.

## 2020-02-11 NOTE — Telephone Encounter (Signed)
Patient is rescheduled to 02/26/20 with AMS

## 2020-02-11 NOTE — H&P (Signed)
Obstetrics Admission History & Physical   CC: nausea and vomiting  HPI:  24 y.o. G2P1001 @ [redacted]w[redacted]d (04/23/2020, by Last Menstrual Period). Admitted on 02/11/2020:   Patient Active Problem List   Diagnosis Date Noted  . Nausea/vomiting in pregnancy 02/11/2020  . Abnormal glucose tolerance affecting pregnancy, antepartum 02/03/2020  . Vaginal discharge during pregnancy 01/31/2020  . Nephrolithiasis 01/18/2020  . Anxiety and depression 01/18/2020  . Supervision of other normal pregnancy, antepartum 01/10/2020  . History of cesarean delivery, antepartum 12/01/2014     Presents for n/v at 29 weeks, worsening this week also w dizziness.  Some airway congestion and discomfort.  No SOB or CP.  Pt reports mild crampy lower abdominal pains.  Denies VB, ROM, d/c.  Pt is not Covid vaccinated.    [facility] care at: at [facility]. Pregnancy complicated by prior CS.  ROS: A review of systems was performed and negative, except as stated in the above HPI. PMHx:  Past Medical History:  Diagnosis Date  . Anemia   . Depression    doing ok now  . Infection    UTI   PSHx:  Past Surgical History:  Procedure Laterality Date  . CESAREAN SECTION N/A 11/28/2014   Procedure: CESAREAN SECTION;  Surgeon: Myna Hidalgo, DO;  Location: [facility];  Service: Obstetrics;  Laterality: N/A;  . FASCIOTOMY     bilateral   Medications:  Medications Prior to Admission  Medication Sig Dispense Refill Last Dose  . Doxylamine-Pyridoxine (DICLEGIS) 10-10 MG TBEC Take 2 tablets by mouth at bedtime. If symptoms persist, add one tablet in the morning and one in the afternoon 100 tablet 5 02/10/2020  . ondansetron (ZOFRAN-ODT) 4 MG disintegrating tablet Take 1 tablet (4 mg total) by mouth every 8 (eight) hours as needed for nausea or vomiting. 40 tablet 0 Past Week at Unknown time  . Prenatal Vit-Fe Fumarate-FA (PRENATAL MULTIVITAMIN) TABS tablet Take 1 tablet by mouth daily.    Past Week at Unknown time   Allergies: is  allergic to latex. OBHx:  OB History  Gravida Para Term Preterm AB Living  2 1 1     1   SAB TAB Ectopic Multiple Live Births        0 1    # Outcome Date GA Lbr Len/2nd Weight Sex Delivery Anes PTL Lv  2 Current           1 Term 11/28/14 [redacted]w[redacted]d 69:10 / 05:46 4060 g M CS-LTranv EPI  LIV   [redacted]w[redacted]d except as detailed in HPI.YQM:GNOIBBCW/UGQBVQXIHWTU  No family history of birth defects. Soc Hx: Alcohol: none and Recreational drug use: none  Objective:   Vitals:   02/11/20 0940 02/11/20 1319  BP:  116/64  Pulse:  (!) 106  Resp: 16   Temp: 98.4 F (36.9 C) 98.5 F (36.9 C)   Constitutional: Well nourished, well developed female in no acute distress.  HEENT: normal Skin: Warm and dry.  Cardiovascular:Regular rate and rhythm.   Extremity: trace to 1+ bilateral pedal edema Respiratory: Clear to auscultation bilateral. Normal respiratory effort Abdomen: gravid, ND, FHT present, mild tenderness on exam Back: no CVAT Neuro: DTRs 2+, Cranial nerves grossly intact Psych: Alert and Oriented x3. No memory deficits. Normal mood and affect.  MS: normal gait, normal bilateral lower extremity ROM/strength/stability.  Pelvic exam: is not limited by body habitus EGBUS: within normal limits Vagina: within normal limits and with normal mucosa Cervix: CERVIX: 0.5 cm dilated, 50 effaced, -3 station Uterus: Spontaneous uterine activity  Adnexa: normal adnexa  EFM:FHR: 140 bpm, variability: moderate,  accelerations:  Present,  decelerations:  Absent Toco: Frequency: Every 7-12 minutes   Perinatal info:  Blood type: O positive Rubella- Immune Varicella -Unknown TDaP tetanus status unknown to the patient RPR NR / HIV Neg/ HBsAg Neg   Assessment & Plan:   24 y.o. G2P1001 @ [redacted]w[redacted]d, Admitted on 02/11/2020:1. POS COVID TEST 2. Nausea and vomiting, Dehydration 3. Eval for PTL    fFN Monitor for ctxs, cervical change If neg, treat as outpatient  IVF, and po fluids at home Zofran, Diclegis Rec  Nexium for GERD as well  MAb team alerted for possibility of MAb therapy.  Discussed pros and cons of this approach w pt.  Annamarie Major, MD, [facility], [facility] 02/11/2020  1:52 PM

## 2020-02-11 NOTE — Telephone Encounter (Signed)
Pt states she has been feeling bad for a week, cant keep anything down, shes going to the hospital to get fluids and tested for covid & Flu.

## 2020-02-11 NOTE — Progress Notes (Signed)
Dr. Tiburcio Pea gave verbal order to d/c fetal monitoring and continue toco monitor.

## 2020-02-11 NOTE — Progress Notes (Signed)
RN provided patient with the monoclonal antibody fact sheet prior to administration to the infusion. Pt is reviewing the information and will let RN know if she wishes to receive the monoclonal antibody therapy.

## 2020-02-18 ENCOUNTER — Encounter: Payer: No Typology Code available for payment source | Admitting: Obstetrics and Gynecology

## 2020-02-26 ENCOUNTER — Encounter: Payer: No Typology Code available for payment source | Admitting: Obstetrics and Gynecology

## 2020-03-02 ENCOUNTER — Encounter: Payer: No Typology Code available for payment source | Admitting: Obstetrics

## 2020-03-02 ENCOUNTER — Other Ambulatory Visit: Payer: Self-pay

## 2020-03-02 ENCOUNTER — Ambulatory Visit (INDEPENDENT_AMBULATORY_CARE_PROVIDER_SITE_OTHER): Payer: No Typology Code available for payment source | Admitting: Obstetrics

## 2020-03-02 VITALS — BP 100/60 | Wt 163.0 lb

## 2020-03-02 DIAGNOSIS — Z3A32 32 weeks gestation of pregnancy: Secondary | ICD-10-CM

## 2020-03-02 DIAGNOSIS — Z3480 Encounter for supervision of other normal pregnancy, unspecified trimester: Secondary | ICD-10-CM

## 2020-03-02 LAB — POCT URINALYSIS DIPSTICK OB: Glucose, UA: NEGATIVE

## 2020-03-02 NOTE — Progress Notes (Signed)
Routine Prenatal Care Visit  Subjective  Jordan Wallace is a 24 y.o. G2P1001 at [redacted]w[redacted]d being seen today for ongoing prenatal care.  She is currently monitored for the following issues for this low-risk pregnancy and has History of cesarean delivery, antepartum; Supervision of other normal pregnancy, antepartum; Nephrolithiasis; Anxiety and depression; Vaginal discharge during pregnancy; Abnormal glucose tolerance affecting pregnancy, antepartum; Nausea/vomiting in pregnancy; and COVID-19 affecting pregnancy in third trimester on their problem list.  ----------------------------------------------------------------------------------- Patient reports no complaints.  She has recovered from COVID (received monoclonal antibodies). Feels well.  .  .   Pincus Large Fluid denies.  ----------------------------------------------------------------------------------- The following portions of the patient's history were reviewed and updated as appropriate: allergies, current medications, past family history, past medical history, past social history, past surgical history and problem list. Problem list updated.  Objective  Blood pressure 100/60, weight 163 lb (73.9 kg), last menstrual period 07/18/2019, unknown if currently breastfeeding. Pregravid weight 147 lb (66.7 kg) Total Weight Gain 16 lb (7.258 kg) Urinalysis: Urine Protein    Urine Glucose    Fetal Status:           General:  Alert, oriented and cooperative. Patient is in no acute distress.  Skin: Skin is warm and dry. No rash noted.   Cardiovascular: Normal heart rate noted  Respiratory: Normal respiratory effort, no problems with respiration noted  Abdomen: Soft, gravid, appropriate for gestational age.       Pelvic:  Cervical exam deferred        Extremities: Normal range of motion.     Mental Status: Normal mood and affect. Normal behavior. Normal judgment and thought content.   Assessment   24 y.o. G2P1001 at [redacted]w[redacted]d by  04/23/2020, by  Last Menstrual Period presenting for routine prenatal visit  Plan   June 2021 Problems (from 09/22/19 to present)    Problem Noted Resolved   Supervision of other normal pregnancy, antepartum 01/10/2020 by Vena Austria, MD No   Overview Addendum 02/07/2020 12:26 PM by Vena Austria, MD    Clinic [facility]) Prenatal Labs  Dating LMP=9 week [place] Blood type: O pos  Genetic Screen Quad:negative 11/29/2019 Antibody: negative  Anatomic [place] Normal female (outside scan) Rubella: Immune Varicella: @VZVIGG @  GTT Third trimester: 143 3-hr: 83, 177, 164, 125 RPR: negative  Rhogam N/A HBsAg: negative  TDaP vaccine                       Flu Shot: HIV: negative  Baby Food                                GBS:   Contraception  Pap: 08/30/2017 NILM  CBB     CS/VBAC Undecided.  2016 C-section FTP 8lbs 15oz   Support Person 2017           Previous Version   History of cesarean delivery, antepartum 12/01/2014 by 01/31/2015, CNM No       Preterm labor symptoms and general obstetric precautions including but not limited to vaginal bleeding, contractions, leaking of fluid and fetal movement were reviewed in detail with the patient. Please refer to After Visit Summary for other counseling recommendations.  Discussed breastfeeding today. Contraceptive plans uncertain. Has used IUDs in the past, but had ongoing spotting for several years. Encouraegd to develop a plan.  Return in about 2 weeks (around 03/16/2020) for return OB.  03/18/2020,  CNM  03/02/2020 9:36 AM

## 2020-03-05 ENCOUNTER — Telehealth: Payer: Self-pay | Admitting: Obstetrics and Gynecology

## 2020-03-05 NOTE — Telephone Encounter (Signed)
Patient has been added to schedule for upcoming appointment

## 2020-03-05 NOTE — Telephone Encounter (Signed)
-----   Message from Conard Novak, MD sent at 03/05/2020  2:56 PM EST ----- Regarding: Schedule OB appts Would you mind scheduling her weekly appts starting week of 12/27 until her surgery? She needs to see either me, AMS, or RPH.  Needs to be scheduled 8-9AM.  OK to double book me.  Can't speak for AMS or RPH on this. She's always OK to be double/over booked on my schedule.  On 12/17 can you add her to my schedule first possible appt?  Before 10 am preferred. My schedule is light that day due to the party.  So, she can be double-booked.  Let me know. Thanks

## 2020-03-13 ENCOUNTER — Ambulatory Visit (INDEPENDENT_AMBULATORY_CARE_PROVIDER_SITE_OTHER): Payer: No Typology Code available for payment source | Admitting: Obstetrics and Gynecology

## 2020-03-13 ENCOUNTER — Other Ambulatory Visit: Payer: Self-pay

## 2020-03-13 ENCOUNTER — Encounter: Payer: Self-pay | Admitting: Obstetrics and Gynecology

## 2020-03-13 VITALS — BP 118/74 | Wt 164.0 lb

## 2020-03-13 DIAGNOSIS — Z3A34 34 weeks gestation of pregnancy: Secondary | ICD-10-CM

## 2020-03-13 DIAGNOSIS — O34219 Maternal care for unspecified type scar from previous cesarean delivery: Secondary | ICD-10-CM

## 2020-03-13 DIAGNOSIS — O98513 Other viral diseases complicating pregnancy, third trimester: Secondary | ICD-10-CM

## 2020-03-13 DIAGNOSIS — U071 COVID-19: Secondary | ICD-10-CM

## 2020-03-13 DIAGNOSIS — O0993 Supervision of high risk pregnancy, unspecified, third trimester: Secondary | ICD-10-CM

## 2020-03-13 NOTE — Progress Notes (Signed)
Routine Prenatal Care Visit  Subjective  Jordan Wallace is a 24 y.o. G2P1001 at [redacted]w[redacted]d being seen today for ongoing prenatal care.  She is currently monitored for the following issues for this high-risk pregnancy and has History of cesarean delivery, antepartum; Supervision of high risk pregnancy, antepartum; Nephrolithiasis; Anxiety and depression; Vaginal discharge during pregnancy; Abnormal glucose tolerance affecting pregnancy, antepartum; Nausea/vomiting in pregnancy; and COVID-19 affecting pregnancy in third trimester on their problem list.  ----------------------------------------------------------------------------------- Patient reports no complaints.   Contractions: Not present. Vag. Bleeding: None.  Movement: Present. Leaking Fluid denies.  ----------------------------------------------------------------------------------- The following portions of the patient's history were reviewed and updated as appropriate: allergies, current medications, past family history, past medical history, past social history, past surgical history and problem list. Problem list updated.  Objective  Blood pressure 118/74, weight 164 lb (74.4 kg), last menstrual period 07/18/2019, unknown if currently breastfeeding. Pregravid weight 147 lb (66.7 kg) Total Weight Gain 17 lb (7.711 kg) Urinalysis: Urine Protein    Urine Glucose    Fetal Status: Fetal Heart Rate (bpm): 140 Fundal Height: 34 cm Movement: Present  Presentation: Vertex  General:  Alert, oriented and cooperative. Patient is in no acute distress.  Skin: Skin is warm and dry. No rash noted.   Cardiovascular: Normal heart rate noted  Respiratory: Normal respiratory effort, no problems with respiration noted  Abdomen: Soft, gravid, appropriate for gestational age. Pain/Pressure: Absent     Pelvic:  Cervical exam performed Dilation: 3 Effacement (%): 30 Station: -3  Extremities: Normal range of motion.  Edema: None  Mental Status: Normal mood  and affect. Normal behavior. Normal judgment and thought content.   Assessment   24 y.o. G2P1001 at [redacted]w[redacted]d by  04/23/2020, by Last Menstrual Period presenting for routine prenatal visit  Plan   June 2021 Problems (from 09/22/19 to present)    Problem Noted Resolved   Supervision of high risk pregnancy, antepartum 01/10/2020 by Vena Austria, MD No   Overview Addendum 03/02/2020 10:02 AM by Mirna Mires, CNM    Clinic [facility]) Prenatal Labs  Dating LMP=9 week [place] Blood type: O pos  Genetic Screen Quad:negative 11/29/2019 Antibody: negative  Anatomic [place] Normal female (outside scan) Rubella: Immune Varicella: @VZVIGG @  GTT Third trimester: 143 3-hr: 83, 177, 164, 125 RPR: negative  Rhogam N/A HBsAg: negative  TDaP vaccine                       Flu Shot: HIV: negative  Baby Food                     Breast           GBS:   Contraception Undecided- IUD? Pap: 08/30/2017 NILM  CBB     CS/VBAC  2016 C-section FTP 8lbs 15oz   Support Person 2017           Previous Version   History of cesarean delivery, antepartum 12/01/2014 by 01/31/2015, CNM No       Preterm labor symptoms and general obstetric precautions including but not limited to vaginal bleeding, contractions, leaking of fluid and fetal movement were reviewed in detail with the patient. Please refer to After Visit Summary for other counseling recommendations.   Discussed significance of some dilation at this time. She is not contracting regularly with any noticeable strength. Her likelihood of delivering within the next 7 days is low. So, we will forgo BMTZ at this time.  She  has very close follow up (12/22 appt).   Return in about 1 week (around 03/20/2020) for previously scheduled ROB appts (schedule growth/afi for 2 weeks).   Thomasene Mohair, MD, [facility], [facility] 03/13/2020 10:40 AM

## 2020-03-16 ENCOUNTER — Encounter: Payer: No Typology Code available for payment source | Admitting: Obstetrics

## 2020-03-18 ENCOUNTER — Ambulatory Visit (INDEPENDENT_AMBULATORY_CARE_PROVIDER_SITE_OTHER): Payer: No Typology Code available for payment source | Admitting: Obstetrics & Gynecology

## 2020-03-18 ENCOUNTER — Other Ambulatory Visit (HOSPITAL_COMMUNITY)
Admission: RE | Admit: 2020-03-18 | Discharge: 2020-03-18 | Disposition: A | Discharge status: Home / Self Care | Payer: No Typology Code available for payment source | Source: Ambulatory Visit | Attending: Obstetrics & Gynecology | Admitting: Obstetrics & Gynecology

## 2020-03-18 ENCOUNTER — Other Ambulatory Visit: Payer: Self-pay

## 2020-03-18 ENCOUNTER — Encounter: Payer: Self-pay | Admitting: Obstetrics & Gynecology

## 2020-03-18 VITALS — BP 108/62 | Wt 165.0 lb

## 2020-03-18 DIAGNOSIS — Z3685 Encounter for antenatal screening for Streptococcus B: Secondary | ICD-10-CM

## 2020-03-18 DIAGNOSIS — Z3A34 34 weeks gestation of pregnancy: Secondary | ICD-10-CM

## 2020-03-18 DIAGNOSIS — O34219 Maternal care for unspecified type scar from previous cesarean delivery: Secondary | ICD-10-CM

## 2020-03-18 DIAGNOSIS — Z124 Encounter for screening for malignant neoplasm of cervix: Secondary | ICD-10-CM | POA: Insufficient documentation

## 2020-03-18 DIAGNOSIS — O0993 Supervision of high risk pregnancy, unspecified, third trimester: Secondary | ICD-10-CM

## 2020-03-18 NOTE — Progress Notes (Signed)
  Subjective  Fetal Movement? yes Contractions? Yes - irreg for the last 2 days Leaking Fluid? no Vaginal Bleeding? no  Objective  BP 108/62   Wt 165 lb (74.8 kg)   LMP 07/18/2019   BMI 29.23 kg/m  General: NAD Pumonary: no increased work of breathing Abdomen: gravid, non-tender Extremities: no edema Psychiatric: mood appropriate, affect full SVE: 3/70/-3  Assessment  24 y.o. G2P1001 at [redacted]w[redacted]d by  04/23/2020, by Last Menstrual Period presenting for routine prenatal visit  Plan   Problem List Items Addressed This Visit      Other   History of cesarean delivery, antepartum    Other Visit Diagnoses    [redacted] weeks gestation of pregnancy    -  Primary   Supervision of high risk pregnancy in third trimester       Screening for cervical cancer       Relevant Orders   Cytology - PAP   Encounter for antenatal screening for Streptococcus B       Relevant Orders   Culture, beta strep (group b only)   Preterm labor in third trimester without delivery       Relevant Orders   Fetal fibronectin      June 2021 Problems (from 09/22/19 to present)    Problem Noted Resolved   Supervision of high risk pregnancy, antepartum 01/10/2020 by Vena Austria, MD No   Overview Addendum 03/02/2020 10:02 AM by Mirna Mires, CNM    [facility]) Prenatal Labs  Dating LMP=9 week [place] Blood type: O pos  Genetic Screen Quad:negative 11/29/2019 Antibody: negative  Anatomic [place] Normal female (outside scan) Rubella: Immune Varicella: @VZVIGG @  GTT Third trimester: 143 3-hr: 83, 177, 164, 125 RPR: negative  Rhogam N/A HBsAg: negative  TDaP vaccine                       Flu Shot: HIV: negative  Baby Food                     Breast           GBS:   Contraception Undecided- IUD? Pap: 08/30/2017 NILM  CBB     CS/VBAC  2016 C-section FTP 8lbs 15oz   Support Person 2017           Previous Version   History of cesarean delivery, antepartum 12/01/2014 by 01/31/2015, CNM No      fFN today GBS today PAP/cultures today Monitor for s/sx PTL CS planned, urgent if in labor Plan BMZ if fFN pos OOW today, longer if pos Work restrictions this point forward discussed  Nigel Bridgeman, MD, [facility], [facility] 03/18/2020  9:19 AM

## 2020-03-18 NOTE — Patient Instructions (Signed)
Preventing Preterm Birth Preterm birth is when your baby is delivered between 20 weeks and 37 weeks of pregnancy. A full-term pregnancy lasts for at least 37 weeks. Preterm birth can be dangerous for your baby because the last few weeks of pregnancy are an important time for your baby's brain and lungs to grow. Many things can cause a baby to be born early. Sometimes the cause is not known. There are certain factors that make you more likely to experience preterm birth, such as:  Having a previous baby born preterm.  Being pregnant with twins or other multiples.  Having had fertility treatment.  Being overweight or underweight at the start of your pregnancy.  Having any of the following during pregnancy: ? An infection, including a urinary tract infection (UTI) or an STI (sexually transmitted infection). ? High blood pressure. ? Diabetes. ? Vaginal bleeding.  Being age 35 or older.  Being age 18 or younger.  Getting pregnant within 6 months of a previous pregnancy.  Suffering extreme stress or physical or emotional abuse during pregnancy.  Standing for long periods of time during pregnancy, such as working at a job that requires standing. What are the risks? The most serious risk of preterm birth is that the baby may not survive. This is more likely to happen if a baby is born before 34 weeks. Other risks and complications of preterm birth may include your baby having:  Breathing problems.  Brain damage that affects movement and coordination (cerebral palsy).  Feeding difficulties.  Vision or hearing problems.  Infections or inflammation of the digestive tract (colitis).  Developmental delays.  Learning disabilities.  Higher risk for diabetes, heart disease, and high blood pressure later in life. What can I do to lower my risk?  Medical care The most important thing you can do to lower your risk for preterm birth is to get routine medical care during pregnancy (prenatal  care). If you have a high risk of preterm birth, you may be referred to a health care provider who specializes in managing high-risk pregnancies (perinatologist). You may be given medicine to help prevent preterm birth. Lifestyle changes Certain lifestyle changes can also lower your risk of preterm birth:  Wait at least 6 months after a pregnancy to become pregnant again.  Try to plan pregnancy for when you are between 19 and 35 years old.  Get to a healthy weight before getting pregnant. If you are overweight, work with your health care provider to safely lose weight.  Do not use any products that contain nicotine or tobacco, such as cigarettes and e-cigarettes. If you need help quitting, ask your health care provider.  Do not drink alcohol.  Do not use drugs. Where to find support For more support, consider:  Talking with your health care provider.  Talking with a therapist or substance abuse counselor, if you need help quitting.  Working with a diet and nutrition specialist (dietitian) or a personal trainer to maintain a healthy weight.  Joining a support group. Where to find more information Learn more about preventing preterm birth from:  Centers for Disease Control and Prevention: cdc.gov/reproductivehealth/maternalinfanthealth/pretermbirth.htm  March of Dimes: marchofdimes.org/complications/premature-babies.aspx  American Pregnancy Association: americanpregnancy.org/labor-and-birth/premature-labor Contact a health care provider if:  You have any of the following signs of preterm labor before 37 weeks: ? A change or increase in vaginal discharge. ? Fluid leaking from your vagina. ? Pressure or cramps in your lower abdomen. ? A backache that does not go away or gets worse. ?   Regular tightening (contractions) in your lower abdomen. Summary  Preterm birth means having your baby during weeks 20-37 of pregnancy.  Preterm birth may put your baby at risk for physical and  mental problems.  Getting good prenatal care can help prevent preterm birth.  You can lower your risk of preterm birth by making certain lifestyle changes, such as not smoking and not using alcohol. This information is not intended to replace advice given to you by your health care provider. Make sure you discuss any questions you have with your health care provider. Document Revised: 02/24/2017 Document Reviewed: 11/21/2015 Elsevier Patient Education  2020 Elsevier Inc.  

## 2020-03-19 ENCOUNTER — Telehealth: Payer: Self-pay | Admitting: Obstetrics & Gynecology

## 2020-03-19 ENCOUNTER — Telehealth: Payer: Self-pay

## 2020-03-19 NOTE — Telephone Encounter (Signed)
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  [phone]  Needs new note to be taken out of work.  Pt states will be talking to HR today.  Adv PH on call today but msg will be sent to him.

## 2020-03-19 NOTE — Telephone Encounter (Signed)
OK to provide out of work note effectively immediately until post partum please

## 2020-03-19 NOTE — Telephone Encounter (Signed)
Patient is calling requesting test result status from apt on 03/18/20 Please advise.

## 2020-03-20 ENCOUNTER — Encounter: Payer: Self-pay | Admitting: Obstetrics and Gynecology

## 2020-03-20 ENCOUNTER — Other Ambulatory Visit: Payer: Self-pay | Admitting: Obstetrics and Gynecology

## 2020-03-20 ENCOUNTER — Observation Stay
Admission: EM | Admit: 2020-03-20 | Discharge: 2020-03-20 | Disposition: A | Discharge status: Home / Self Care | Payer: No Typology Code available for payment source | Attending: Obstetrics and Gynecology | Admitting: Obstetrics and Gynecology

## 2020-03-20 DIAGNOSIS — Z3A35 35 weeks gestation of pregnancy: Secondary | ICD-10-CM | POA: Insufficient documentation

## 2020-03-20 DIAGNOSIS — O26893 Other specified pregnancy related conditions, third trimester: Secondary | ICD-10-CM | POA: Insufficient documentation

## 2020-03-20 DIAGNOSIS — O34219 Maternal care for unspecified type scar from previous cesarean delivery: Secondary | ICD-10-CM

## 2020-03-20 DIAGNOSIS — R8789 Other abnormal findings in specimens from female genital organs: Secondary | ICD-10-CM

## 2020-03-20 DIAGNOSIS — R102 Pelvic and perineal pain: Secondary | ICD-10-CM | POA: Diagnosis not present

## 2020-03-20 DIAGNOSIS — O99891 Other specified diseases and conditions complicating pregnancy: Principal | ICD-10-CM | POA: Diagnosis present

## 2020-03-20 DIAGNOSIS — M549 Dorsalgia, unspecified: Secondary | ICD-10-CM | POA: Insufficient documentation

## 2020-03-20 DIAGNOSIS — O2343 Unspecified infection of urinary tract in pregnancy, third trimester: Secondary | ICD-10-CM | POA: Diagnosis not present

## 2020-03-20 DIAGNOSIS — O09899 Supervision of other high risk pregnancies, unspecified trimester: Secondary | ICD-10-CM

## 2020-03-20 DIAGNOSIS — O0990 Supervision of high risk pregnancy, unspecified, unspecified trimester: Secondary | ICD-10-CM

## 2020-03-20 DIAGNOSIS — O288 Other abnormal findings on antenatal screening of mother: Secondary | ICD-10-CM | POA: Insufficient documentation

## 2020-03-20 DIAGNOSIS — Z3490 Encounter for supervision of normal pregnancy, unspecified, unspecified trimester: Secondary | ICD-10-CM

## 2020-03-20 LAB — URINALYSIS, ROUTINE W REFLEX MICROSCOPIC
Bilirubin Urine: NEGATIVE
Glucose, UA: NEGATIVE mg/dL
Hgb urine dipstick: NEGATIVE
Ketones, ur: NEGATIVE mg/dL
Nitrite: NEGATIVE
Protein, ur: 30 mg/dL — AB
Specific Gravity, Urine: 1.012 (ref 1.005–1.030)
pH: 7 (ref 5.0–8.0)

## 2020-03-20 LAB — FETAL FIBRONECTIN: Fetal Fibronectin: POSITIVE — AB

## 2020-03-20 MED ORDER — CEPHALEXIN 500 MG PO CAPS
500.0000 mg | ORAL_CAPSULE | Freq: Two times a day (BID) | ORAL | Status: DC
  Administered 2020-03-20: 500 mg via ORAL
  Filled 2020-03-20: qty 1

## 2020-03-20 MED ORDER — BETAMETHASONE SOD PHOS & ACET 6 (3-3) MG/ML IJ SUSP
12.0000 mg | INTRAMUSCULAR | Status: DC
  Administered 2020-03-20: 12 mg via INTRAMUSCULAR
  Filled 2020-03-20: qty 5

## 2020-03-20 MED ORDER — CEPHALEXIN 500 MG PO CAPS: 500.0000 mg | ORAL_CAPSULE | Freq: Two times a day (BID) | ORAL | 0 refills | Status: DC

## 2020-03-20 MED ORDER — BETAMETHASONE SOD PHOS & ACET 6 (3-3) MG/ML IJ SUSP
12.0000 mg | Freq: Once | INTRAMUSCULAR | Status: DC
Start: 2020-03-20 — End: 2020-03-20

## 2020-03-20 NOTE — Final Progress Note (Signed)
Final Progress Note  Patient ID: Jordan Wallace MRN: [id number] DOB/AGE: April 30, 1995 24 y.o.  Admit date: 03/20/2020 Admitting provider: Zipporah Plants, CNM Discharge date: 03/20/2020   Admission Diagnoses: Back pain affecting pregnancy in third trimester Pelvic pressure in pregnancy, antepartum, third trimester Positive fetal fibronectin at 22 weeks to [redacted] weeks gestation  Discharge Diagnoses:  Active Problems:   Pregnancy   Back pain affecting pregnancy in third trimester   Pelvic pressure in pregnancy, antepartum, third trimester   Positive fetal fibronectin at 22 weeks to [redacted] weeks gestation   UTI (urinary tract infection) in pregnancy in third trimester  History of Present Illness: The patient is a 24 y.o. female G2P1001 at [redacted]w[redacted]d who presents following +FFN at [facility] visit on 03/18/20. Patient had been seen by Dr. Tiburcio Pea for ROB with concern on 03/18/20 for intermittent contractions. Plan had been made with Dr. Tiburcio Pea for course of steroids if FFN resulted positive. Today, on presentation to [facility] triage, patient reports occasional contractions (about 4 per day over the last couple days), patient also describes mild lower back pain and suprapubic discomfort. Reports +FM, denies LOF or VB.  Past Medical History:  Diagnosis Date  . Anemia   . Depression    doing ok now  . Infection    UTI    Past Surgical History:  Procedure Laterality Date  . CESAREAN SECTION N/A 11/28/2014   Procedure: CESAREAN SECTION;  Surgeon: Myna Hidalgo, DO;  Location: [facility];  Service: Obstetrics;  Laterality: N/A;  . FASCIOTOMY     bilateral    No current facility-administered medications on file prior to encounter.   Current Outpatient Medications on File Prior to Encounter  Medication Sig Dispense Refill  . Doxylamine-Pyridoxine (DICLEGIS) 10-10 MG TBEC Take 2 tablets by mouth at bedtime. If symptoms persist, add one tablet in the morning and one in the afternoon 100 tablet 5  . famotidine (PEPCID)  40 MG tablet Take 40 mg by mouth daily.    . ondansetron (ZOFRAN-ODT) 4 MG disintegrating tablet Take 1 tablet (4 mg total) by mouth every 8 (eight) hours as needed for nausea or vomiting. 40 tablet 0  . Prenatal Vit-Fe Fumarate-FA (PRENATAL MULTIVITAMIN) TABS tablet Take 1 tablet by mouth daily.       Allergies  Allergen Reactions  . Latex Hives    Social History   Socioeconomic History  . Marital status: Single    Spouse name: Not on file  . Number of children: Not on file  . Years of education: Not on file  . Highest education level: Not on file  Occupational History  . Occupation: Land: [employer]  Tobacco Use  . Smoking status: Never Smoker  . Smokeless tobacco: Never Used  Substance and Sexual Activity  . Alcohol use: No  . Drug use: No  . Sexual activity: Yes    Birth control/protection: None  Other Topics Concern  . Not on file  Social History Narrative  . Not on file   Social Determinants of Health   Financial Resource Strain: Not on file  Food Insecurity: Not on file  Transportation Needs: Not on file  Physical Activity: Not on file  Stress: Not on file  Social Connections: Not on file  Intimate Partner Violence: Not on file    Family History  Problem Relation Age of Onset  . Diabetes Paternal Grandmother   . Diabetes Paternal Grandfather   . Alcohol abuse Neg Hx   . Arthritis Neg  Hx   . Asthma Neg Hx   . Birth defects Neg Hx   . Cancer Neg Hx   . COPD Neg Hx   . Depression Neg Hx   . Drug abuse Neg Hx   . Early death Neg Hx   . Hearing loss Neg Hx   . Heart disease Neg Hx   . Hyperlipidemia Neg Hx   . Hypertension Neg Hx   . Kidney disease Neg Hx   . Learning disabilities Neg Hx   . Mental illness Neg Hx   . Mental retardation Neg Hx   . Miscarriages / Stillbirths Neg Hx   . Stroke Neg Hx   . Vision loss Neg Hx   . Varicose Veins Neg Hx      Review of Systems  Constitutional: Negative for fever and  malaise/fatigue.  HENT: Negative.   Eyes: Negative.   Respiratory: Negative.   Cardiovascular: Negative.   Gastrointestinal: Positive for abdominal pain. Negative for constipation, nausea and vomiting.       Suprapubic discomfort  Musculoskeletal: Positive for back pain.       Lower back pain  Skin: Negative.   Neurological: Negative.   Endo/Heme/Allergies: Negative.   Psychiatric/Behavioral: Negative.      Physical Exam: BP 123/79 (BP Location: Left Arm)   Pulse 91   Temp 98 F (36.7 C) (Oral)   Resp 18   LMP 07/18/2019   Physical Exam Constitutional:      Appearance: Normal appearance.  Genitourinary:     Genitourinary Comments: deferred  HENT:     Head: Normocephalic.     Mouth/Throat:     Mouth: Mucous membranes are moist.     Pharynx: Oropharynx is clear.  Eyes:     Pupils: Pupils are equal, round, and reactive to light.  Cardiovascular:     Rate and Rhythm: Normal rate and regular rhythm.     Pulses: Normal pulses.  Pulmonary:     Effort: Pulmonary effort is normal.  Abdominal:     Tenderness: There is no right CVA tenderness or left CVA tenderness.     Comments: Gravid - (size = dates)  Musculoskeletal:        General: Normal range of motion.     Cervical back: Normal range of motion.  Neurological:     Mental Status: She is alert and oriented to person, place, and time.  Skin:    General: Skin is warm and dry.  Psychiatric:        Mood and Affect: Mood normal.        Behavior: Behavior normal.     Consults: None  Significant Findings/ Diagnostic Studies: labs: UA - moderate LE, rare bacteria - urine cx: pending  Procedures: NST NONSTRESS TEST INTERPRETATION  INDICATIONS: preterm labor rule out FHR baseline: 145 RESULTS:  A NST procedure was performed with FHR monitoring and a normal baseline established, appropriate time of 20-40 minutes of evaluation, and accels >2 seen w 15x15 characteristics.  Results show a REACTIVE NST.   Hospital Course:  The patient was admitted to Labor and Delivery Triage for observation. Reviewed lab results from office (+FFN). Uterine irritability noted on toco and per patient report. UA and urine cx sent. Discussed BMZ course with patient- reviewed current recommendations. Will proceed with BMZ course - first dose today at 1830, RTC tomorrow for second course. Possible UTI noted on UA, cx pending.  Discharge Condition: good  Disposition: Discharge disposition: 01-Home or Self Care  Diet: Regular diet  Discharge Activity: Activity as tolerated   Allergies as of 03/20/2020      Reactions   Latex Hives      Medication List    TAKE these medications   cephALEXin 500 MG capsule Commonly known as: KEFLEX Take 1 capsule (500 mg total) by mouth 2 (two) times daily.   Doxylamine-Pyridoxine 10-10 MG Tbec Commonly known as: Diclegis Take 2 tablets by mouth at bedtime. If symptoms persist, add one tablet in the morning and one in the afternoon   famotidine 40 MG tablet Commonly known as: PEPCID Take 40 mg by mouth daily.   ondansetron 4 MG disintegrating tablet Commonly known as: ZOFRAN-ODT Take 1 tablet (4 mg total) by mouth every 8 (eight) hours as needed for nausea or vomiting.   prenatal multivitamin Tabs tablet Take 1 tablet by mouth daily.        Total time spent taking care of this patient: 30 minutes  Signed:  Zipporah Plants, CNM 03/20/2020, 6:28 PM

## 2020-03-20 NOTE — Discharge Summary (Signed)
See final progress note. 

## 2020-03-20 NOTE — Discharge Summary (Signed)
Patient given discharge instructions. Patient made aware of medication and instructions on taking antibiotic. Patient verbalizes understanding of medication and plan of care. Patient will return to [facility] L&D tomorrow (approximately 24 hours) for follow up betamethasone shot. Patient verbalizes understanding of discharge instructions. Patient well and ambulatory, destination home.

## 2020-03-21 ENCOUNTER — Inpatient Hospital Stay
Admission: EM | Admit: 2020-03-21 | Discharge: 2020-03-21 | Disposition: A | Discharge status: Home / Self Care | Payer: No Typology Code available for payment source | Attending: Obstetrics & Gynecology | Admitting: Obstetrics & Gynecology

## 2020-03-21 ENCOUNTER — Other Ambulatory Visit: Payer: Self-pay | Admitting: Obstetrics & Gynecology

## 2020-03-21 DIAGNOSIS — O2343 Unspecified infection of urinary tract in pregnancy, third trimester: Secondary | ICD-10-CM | POA: Diagnosis not present

## 2020-03-21 DIAGNOSIS — O26893 Other specified pregnancy related conditions, third trimester: Secondary | ICD-10-CM | POA: Insufficient documentation

## 2020-03-21 DIAGNOSIS — N390 Urinary tract infection, site not specified: Secondary | ICD-10-CM | POA: Insufficient documentation

## 2020-03-21 DIAGNOSIS — M549 Dorsalgia, unspecified: Secondary | ICD-10-CM | POA: Insufficient documentation

## 2020-03-21 DIAGNOSIS — O99893 Other specified diseases and conditions complicating puerperium: Secondary | ICD-10-CM | POA: Insufficient documentation

## 2020-03-21 DIAGNOSIS — R102 Pelvic and perineal pain: Secondary | ICD-10-CM | POA: Diagnosis not present

## 2020-03-21 DIAGNOSIS — O0993 Supervision of high risk pregnancy, unspecified, third trimester: Secondary | ICD-10-CM | POA: Insufficient documentation

## 2020-03-21 DIAGNOSIS — R8789 Other abnormal findings in specimens from female genital organs: Secondary | ICD-10-CM | POA: Insufficient documentation

## 2020-03-21 LAB — URINE CULTURE: Culture: 80000 — AB

## 2020-03-21 MED ORDER — CEPHALEXIN 500 MG PO CAPS
500.0000 mg | ORAL_CAPSULE | Freq: Once | ORAL | Status: AC
  Administered 2020-03-21: 500 mg via ORAL
  Filled 2020-03-21: qty 1

## 2020-03-21 MED ORDER — BETAMETHASONE SOD PHOS & ACET 6 (3-3) MG/ML IJ SUSP
12.0000 mg | Freq: Once | INTRAMUSCULAR | Status: AC
  Administered 2020-03-21: 12 mg via INTRAMUSCULAR

## 2020-03-21 MED ORDER — CEPHALEXIN 500 MG PO CAPS
500.0000 mg | ORAL_CAPSULE | Freq: Two times a day (BID) | ORAL | 0 refills | Status: DC
Start: 2020-03-21 — End: 2020-04-03

## 2020-03-21 NOTE — Progress Notes (Addendum)
Pt arrived to L&D for repeat dose of Betamethasone shot and Keflex capsule. Pt states she had a migraine aura today but subsided with neck massage. Pt is feeling positive fetal movement and denies LOF & VB. Pt stable at time of discharge with significant other.

## 2020-03-22 LAB — CULTURE, BETA STREP (GROUP B ONLY): Strep Gp B Culture: NEGATIVE

## 2020-03-23 LAB — CYTOLOGY - PAP
Chlamydia: NEGATIVE
Comment: NEGATIVE
Comment: NORMAL
Diagnosis: NEGATIVE
Neisseria Gonorrhea: NEGATIVE

## 2020-03-24 ENCOUNTER — Ambulatory Visit (INDEPENDENT_AMBULATORY_CARE_PROVIDER_SITE_OTHER): Payer: No Typology Code available for payment source

## 2020-03-24 ENCOUNTER — Other Ambulatory Visit: Payer: Self-pay

## 2020-03-24 DIAGNOSIS — O34219 Maternal care for unspecified type scar from previous cesarean delivery: Secondary | ICD-10-CM | POA: Diagnosis not present

## 2020-03-24 DIAGNOSIS — O0993 Supervision of high risk pregnancy, unspecified, third trimester: Secondary | ICD-10-CM | POA: Diagnosis not present

## 2020-03-26 ENCOUNTER — Encounter: Payer: Self-pay | Admitting: Obstetrics & Gynecology

## 2020-03-26 ENCOUNTER — Other Ambulatory Visit: Payer: Self-pay

## 2020-03-26 ENCOUNTER — Ambulatory Visit (INDEPENDENT_AMBULATORY_CARE_PROVIDER_SITE_OTHER): Payer: No Typology Code available for payment source | Admitting: Obstetrics & Gynecology

## 2020-03-26 VITALS — BP 120/80 | Wt 167.0 lb

## 2020-03-26 DIAGNOSIS — Z3A36 36 weeks gestation of pregnancy: Secondary | ICD-10-CM

## 2020-03-26 DIAGNOSIS — O0993 Supervision of high risk pregnancy, unspecified, third trimester: Secondary | ICD-10-CM

## 2020-03-26 DIAGNOSIS — O34219 Maternal care for unspecified type scar from previous cesarean delivery: Secondary | ICD-10-CM

## 2020-03-26 NOTE — Patient Instructions (Signed)
Cesarean Delivery Cesarean birth, or cesarean delivery, is the surgical delivery of a baby through an incision in the abdomen and the uterus. This may be referred to as a C-section. This procedure may be scheduled ahead of time, or it may be done in an emergency situation. Tell a health care provider about:  Any allergies you have.  All medicines you are taking, including vitamins, herbs, eye drops, creams, and over-the-counter medicines.  Any problems you or family members have had with anesthetic medicines.  Any blood disorders you have.  Any surgeries you have had.  Any medical conditions you have.  Whether you or any members of your family have a history of deep vein thrombosis (DVT) or pulmonary embolism (PE). What are the risks? Generally, this is a safe procedure. However, problems may occur, including:  Infection.  Bleeding.  Allergic reactions to medicines.  Damage to other structures or organs.  Blood clots.  Injury to your baby. What happens before the procedure? General instructions  Follow instructions from your health care provider about eating or drinking restrictions.  If you know that you are going to have a cesarean delivery, do not shave your pubic area. Shaving before the procedure may increase your risk of infection.  Plan to have someone take you home from the hospital.  Ask your health care provider what steps will be taken to prevent infection. These may include: ? Removing hair at the surgery site. ? Washing skin with a germ-killing soap. ? Taking antibiotic medicine.  Depending on the reason for your cesarean delivery, you may have a physical exam or additional testing, such as an ultrasound.  You may have your blood or urine tested. Questions for your health care provider  Ask your health care provider about: ? Changing or stopping your regular medicines. This is especially important if you are taking diabetes medicines or blood  thinners. ? Your pain management plan. This is especially important if you plan to breastfeed your baby. ? How long you will be in the hospital after the procedure. ? Any concerns you may have about receiving blood products, if you need them during the procedure. ? Cord blood banking, if you plan to collect your baby's umbilical cord blood.  You may also want to ask your health care provider: ? Whether you will be able to hold or breastfeed your baby while you are still in the operating room. ? Whether your baby can stay with you immediately after the procedure and during your recovery. ? Whether a family member or a person of your choice can go with you into the operating room and stay with you during the procedure, immediately after the procedure, and during your recovery. What happens during the procedure?   An IV will be inserted into one of your veins.  Fluid and medicines, such as antibiotics, will be given before the surgery.  Fetal monitors will be placed on your abdomen to check your baby's heart rate.  You may be given a special warming gown to wear to keep your temperature stable.  A catheter may be inserted into your bladder through your urethra. This drains your urine during the procedure.  You may be given one or more of the following: ? A medicine to numb the area (local anesthetic). ? A medicine to make you fall asleep (general anesthetic). ? A medicine (regional anesthetic) that is injected into your back or through a small thin tube placed in your back (spinal anesthetic or epidural anesthetic).   This numbs everything below the injection site and allows you to stay awake during your procedure. If this makes you feel nauseous, tell your health care provider. Medicines will be available to help reduce any nausea you may feel.  An incision will be made in your abdomen, and then in your uterus.  If you are awake during your procedure, you may feel tugging and pulling in  your abdomen, but you should not feel pain. If you feel pain, tell your health care provider immediately.  Your baby will be removed from your uterus. You may feel more pressure or pushing while this happens.  Immediately after birth, your baby will be dried and kept warm. You may be able to hold and breastfeed your baby.  The umbilical cord may be clamped and cut during this time. This usually occurs after waiting a period of 1-2 minutes after delivery.  Your placenta will be removed from your uterus.  Your incisions will be closed with stitches (sutures). Staples, skin glue, or adhesive strips may also be applied to the incision in your abdomen.  Bandages (dressings) may be placed over the incision in your abdomen. The procedure may vary among health care providers and hospitals. What happens after the procedure?  Your blood pressure, heart rate, breathing rate, and blood oxygen level will be monitored until you are discharged from the hospital.  You may continue to receive fluids and medicines through an IV.  You will have some pain. Medicines will be available to help control your pain.  To help prevent blood clots: ? You may be given medicines. ? You may have to wear compression stockings or devices. ? You will be encouraged to walk around when you are able.  Hospital staff will encourage and support bonding with your baby. Your hospital may have you and your baby to stay in the same room (rooming in) during your hospital stay to encourage successful bonding and breastfeeding.  You may be encouraged to cough and breathe deeply often. This helps to prevent lung problems.  If you have a catheter draining your urine, it will be removed as soon as possible after your procedure. Summary  Cesarean birth, or cesarean delivery, is the surgical delivery of a baby through an incision in the abdomen and the uterus.  Follow instructions from your health care provider about eating or  drinking restrictions before the procedure.  You will have some pain after the procedure. Medicines will be available to help control your pain.  Hospital staff will encourage and support bonding with your baby after the procedure. Your hospital may have you and your baby to stay in the same room (rooming in) during your hospital stay to encourage successful bonding and breastfeeding. This information is not intended to replace advice given to you by your health care provider. Make sure you discuss any questions you have with your health care provider. Document Revised: 09/18/2017 Document Reviewed: 09/18/2017 Elsevier Patient Education  2020 Elsevier Inc.  

## 2020-03-26 NOTE — Progress Notes (Signed)
  Subjective  Fetal Movement? yes Contractions? rare Leaking Fluid? no Vaginal Bleeding? no  Objective  BP 120/80   Wt 167 lb (75.8 kg)   LMP 07/18/2019   BMI 29.58 kg/m  General: NAD Pumonary: no increased work of breathing Abdomen: gravid, non-tender Extremities: no edema Psychiatric: mood appropriate, affect full Cx: 4/80/-3, Vtx Assessment  24 y.o. G2P1001 at [redacted]w[redacted]d by  04/23/2020, by Last Menstrual Period presenting for routine prenatal visit  Plan   Problem List Items Addressed This Visit      Other   History of cesarean delivery, antepartum   Supervision of high risk pregnancy, antepartum   Pregnancy - Primary    PNV, [facility] VBAc and PTL discussed (as she is now 4 cm dilated) Work restrictions (out of work at this time)   Problem Noted Resolved    Clinic [facility]) Prenatal Labs  Dating LMP=9 week [place] Blood type: O pos  Genetic Screen Quad:negative 11/29/2019 Antibody: negative  Anatomic [place] Normal female (outside scan) Rubella: Immune Varicella: Imm  GTT Third trimester: 143 3-hr: 83, 177, 164, 125 RPR: negative  Rhogam N/A HBsAg: negative  TDaP vaccine                       Flu Shot: HIV: negative  Baby Food                     Breast           GBS:   Contraception Undecided- IUD? Pap: 08/30/2017 NILM  CBB     CS/VBAC  2016 C-section FTP 8lbs 15oz   Support Person Mason Jim           Previous Version   History of cesarean delivery, antepartum         Annamarie Major, MD, [facility], [facility] 03/26/2020  10:11 AM

## 2020-03-28 NOTE — L&D Delivery Note (Signed)
Delivery Note At 11:30 PM a viable female was delivered via VBAC, Spontaneous (Presentation: Right Occiput Anterior).  APGAR: 8, 9; weight pending.   Placenta status: Spontaneous, Intact. Cord: 3 vessels with the following complications: None.  Cord pH: n/a  Anesthesia: Epidural Episiotomy: None Lacerations: 2nd degree;Vaginal;Perineal Suture Repair: 3.0 vicryl Qnt. Blood Loss (mL): 1050  Mom to postpartum.  Baby to Couplet care / Skin to Skin.  Called to see patient.  Mom pushed to deliver a viable female infant.  The head followed by shoulders, which delivered without difficulty, and the rest of the body.  No nuchal cord noted.  Baby to mom's chest.  Cord clamped and cut after > 1 min delay.  Cord blood obtained.  Placenta delivered spontaneously, intact, with a 3-vessel cord.  Second degree perineal laceration repaired with 3-0 Vicryl in standard fashion.  All counts correct.  Hemostasis obtained with IV pitocin and fundal massage. QBL 1,050 mL.      Thomasene Mohair, MD 04/02/2020, 12:46 AM

## 2020-03-30 ENCOUNTER — Other Ambulatory Visit: Payer: Self-pay | Admitting: Obstetrics and Gynecology

## 2020-03-30 MED ORDER — ONDANSETRON 4 MG PO TBDP: 4.0000 mg | ORAL_TABLET | Freq: Three times a day (TID) | ORAL | 0 refills | Status: DC | PRN

## 2020-03-30 NOTE — Telephone Encounter (Signed)
Please advise 

## 2020-04-01 ENCOUNTER — Other Ambulatory Visit: Payer: Self-pay

## 2020-04-01 ENCOUNTER — Inpatient Hospital Stay
Admission: EM | Admit: 2020-04-01 | Discharge: 2020-04-03 | DRG: 807 | Disposition: A | Discharge status: Home / Self Care | Payer: No Typology Code available for payment source | Attending: Obstetrics and Gynecology | Admitting: Obstetrics and Gynecology

## 2020-04-01 ENCOUNTER — Encounter: Payer: Self-pay | Admitting: Obstetrics and Gynecology

## 2020-04-01 ENCOUNTER — Inpatient Hospital Stay: Payer: No Typology Code available for payment source | Admitting: Anesthesiology

## 2020-04-01 DIAGNOSIS — O34219 Maternal care for unspecified type scar from previous cesarean delivery: Secondary | ICD-10-CM | POA: Diagnosis present

## 2020-04-01 DIAGNOSIS — Z23 Encounter for immunization: Secondary | ICD-10-CM | POA: Diagnosis not present

## 2020-04-01 DIAGNOSIS — Z3A36 36 weeks gestation of pregnancy: Secondary | ICD-10-CM

## 2020-04-01 DIAGNOSIS — O0990 Supervision of high risk pregnancy, unspecified, unspecified trimester: Secondary | ICD-10-CM

## 2020-04-01 DIAGNOSIS — R8789 Other abnormal findings in specimens from female genital organs: Secondary | ICD-10-CM

## 2020-04-01 DIAGNOSIS — O09899 Supervision of other high risk pregnancies, unspecified trimester: Secondary | ICD-10-CM

## 2020-04-01 LAB — RAPID HIV SCREEN (HIV 1/2 AB+AG)
HIV 1/2 Antibodies: NONREACTIVE
HIV-1 P24 Antigen - HIV24: NONREACTIVE

## 2020-04-01 LAB — CBC
HCT: 33.4 % — ABNORMAL LOW (ref 36.0–46.0)
Hemoglobin: 11.2 g/dL — ABNORMAL LOW (ref 12.0–15.0)
MCH: 29.9 pg (ref 26.0–34.0)
MCHC: 33.5 g/dL (ref 30.0–36.0)
MCV: 89.3 fL (ref 80.0–100.0)
Platelets: 139 10*3/uL — ABNORMAL LOW (ref 150–400)
RBC: 3.74 MIL/uL — ABNORMAL LOW (ref 3.87–5.11)
RDW: 12.9 % (ref 11.5–15.5)
WBC: 13.2 10*3/uL — ABNORMAL HIGH (ref 4.0–10.5)
nRBC: 0 % (ref 0.0–0.2)

## 2020-04-01 LAB — TYPE AND SCREEN
ABO/RH(D): O POS
Antibody Screen: NEGATIVE

## 2020-04-01 MED ORDER — AMMONIA AROMATIC IN INHA
RESPIRATORY_TRACT | Status: AC
  Filled 2020-04-01: qty 10

## 2020-04-01 MED ORDER — MISOPROSTOL 200 MCG PO TABS
ORAL_TABLET | ORAL | Status: AC
  Filled 2020-04-01: qty 4

## 2020-04-01 MED ORDER — LACTATED RINGERS IV SOLN: INTRAVENOUS | Status: DC

## 2020-04-01 MED ORDER — ONDANSETRON 4 MG PO TBDP: 4.0000 mg | ORAL_TABLET | Freq: Three times a day (TID) | ORAL | Status: DC | PRN

## 2020-04-01 MED ORDER — SOD CITRATE-CITRIC ACID 500-334 MG/5ML PO SOLN: 30.0000 mL | ORAL | Status: DC | PRN

## 2020-04-01 MED ORDER — FENTANYL 2.5 MCG/ML W/ROPIVACAINE 0.15% IN NS 100 ML EPIDURAL (ARMC)
EPIDURAL | Status: AC
  Filled 2020-04-01: qty 100

## 2020-04-01 MED ORDER — LIDOCAINE HCL (PF) 1 % IJ SOLN
INTRAMUSCULAR | Status: DC | PRN
  Administered 2020-04-01: 2 mL via SUBCUTANEOUS

## 2020-04-01 MED ORDER — FENTANYL 2.5 MCG/ML W/ROPIVACAINE 0.15% IN NS 100 ML EPIDURAL (ARMC)
12.0000 mL/h | EPIDURAL | Status: DC
  Administered 2020-04-01: 12 mL/h via EPIDURAL

## 2020-04-01 MED ORDER — LACTATED RINGERS IV SOLN
500.0000 mL | Freq: Once | INTRAVENOUS | Status: AC
  Administered 2020-04-01: 500 mL via INTRAVENOUS

## 2020-04-01 MED ORDER — PHENYLEPHRINE 40 MCG/ML (10ML) SYRINGE FOR IV PUSH (FOR BLOOD PRESSURE SUPPORT): 80.0000 ug | PREFILLED_SYRINGE | INTRAVENOUS | Status: DC | PRN

## 2020-04-01 MED ORDER — OXYTOCIN BOLUS FROM INFUSION
333.0000 mL | Freq: Once | INTRAVENOUS | Status: AC
  Administered 2020-04-01: 333 mL via INTRAVENOUS

## 2020-04-01 MED ORDER — FAMOTIDINE 20 MG PO TABS
40.0000 mg | ORAL_TABLET | Freq: Every day | ORAL | Status: DC
  Administered 2020-04-01: 40 mg via ORAL
  Filled 2020-04-01: qty 2

## 2020-04-01 MED ORDER — LACTATED RINGERS IV SOLN: 500.0000 mL | INTRAVENOUS | Status: DC | PRN

## 2020-04-01 MED ORDER — BUTORPHANOL TARTRATE 1 MG/ML IJ SOLN: 2.0000 mg | INTRAMUSCULAR | Status: DC | PRN

## 2020-04-01 MED ORDER — LIDOCAINE-EPINEPHRINE (PF) 1.5 %-1:200000 IJ SOLN
INTRAMUSCULAR | Status: DC | PRN
  Administered 2020-04-01: 3 mL via EPIDURAL

## 2020-04-01 MED ORDER — OXYTOCIN 10 UNIT/ML IJ SOLN
INTRAMUSCULAR | Status: AC
  Filled 2020-04-01: qty 2

## 2020-04-01 MED ORDER — DIPHENHYDRAMINE HCL 50 MG/ML IJ SOLN: 12.5000 mg | INTRAMUSCULAR | Status: DC | PRN

## 2020-04-01 MED ORDER — ONDANSETRON HCL 4 MG/2ML IJ SOLN
4.0000 mg | Freq: Four times a day (QID) | INTRAMUSCULAR | Status: DC | PRN
  Administered 2020-04-01: 4 mg via INTRAVENOUS
  Filled 2020-04-01: qty 2

## 2020-04-01 MED ORDER — SODIUM CHLORIDE 0.9 % IV SOLN
INTRAVENOUS | Status: DC | PRN
  Administered 2020-04-01 (×2): 5 mL via EPIDURAL

## 2020-04-01 MED ORDER — EPHEDRINE 5 MG/ML INJ: 10.0000 mg | INTRAVENOUS | Status: DC | PRN

## 2020-04-01 MED ORDER — OXYTOCIN-SODIUM CHLORIDE 30-0.9 UT/500ML-% IV SOLN
2.5000 [IU]/h | INTRAVENOUS | Status: DC
  Filled 2020-04-01: qty 500

## 2020-04-01 MED ORDER — CEPHALEXIN 500 MG PO CAPS
500.0000 mg | ORAL_CAPSULE | Freq: Two times a day (BID) | ORAL | Status: DC
  Filled 2020-04-01: qty 1

## 2020-04-01 MED ORDER — ACETAMINOPHEN 325 MG PO TABS: 650.0000 mg | ORAL_TABLET | ORAL | Status: DC | PRN

## 2020-04-01 MED ORDER — LIDOCAINE HCL (PF) 1 % IJ SOLN: 30.0000 mL | INTRAMUSCULAR | Status: DC | PRN

## 2020-04-01 MED ORDER — LIDOCAINE HCL (PF) 1 % IJ SOLN
INTRAMUSCULAR | Status: AC
  Filled 2020-04-01: qty 30

## 2020-04-01 NOTE — H&P (Signed)
Jordan Wallace is an 25 y.o. female.   Chief Complaint: uterine contractiosn HPI: she presents to L&D today complaining of contractions. She reports the contractions began overnight an have been occurring every 4 minutes. She was seen previously and was dilated to 4cm. She had a positive FFN on 12/22 and received betamethasone injections on 12/24 and 12/25.   She has been considering TOLAC. She achieved 10 cm with her last pregnancy but pushed for 5 hours before arrest of descent was diagnosed and she had a primary cesarean section. She would like to proceed with a TOLAC.  She is aware of her risks of uterine rupture which could be life threatening and cause serious injury to her or the baby.   Growth [place] on 03/24/2020 showed EFW: 2,974 g  (6 lb 9 oz)  June 2021 Problems (from 09/22/19 to present)    Problem Noted Resolved   Positive fetal fibronectin at 22 weeks to [redacted] weeks gestation 03/20/2020 by Zipporah Plants, CNM No   Supervision of high risk pregnancy, antepartum 01/10/2020 by Vena Austria, MD No   Overview Addendum 03/26/2020 10:15 AM by Nadara Mustard, MD    [facility] [place] Prenatal Labs  Dating LMP=9 week [place] Blood type: O pos  Genetic Screen Quad:negative 11/29/2019 Antibody: negative  Anatomic [place] Normal female (outside scan) Rubella: Immune Varicella: Imm  GTT Third trimester: 143 3-hr: 83, 177, 164, 125 RPR: negative  Rhogam N/A HBsAg: negative  TDaP vaccine             Flu Shot:10/21 HIV: negative  Baby Food  Breast           JEH:UDJSHFWY/-- (12/22 1401)  Contraception Undecided- IUD? Pap: 08/30/2017 NILM  CBB     CS/VBAC  2016 C-section FTP 8lbs 15oz   Support Person Mason Jim           Previous Version   History of cesarean delivery, antepartum 12/01/2014 by Nigel Bridgeman, CNM No       Past Medical History:  Diagnosis Date  . Anemia   . Depression    doing ok now  . Infection    UTI    Past Surgical History:  Procedure Laterality Date   . CESAREAN SECTION N/A 11/28/2014   Procedure: CESAREAN SECTION;  Surgeon: Myna Hidalgo, DO;  Location: [facility];  Service: Obstetrics;  Laterality: N/A;  . FASCIOTOMY     bilateral    Family History  Problem Relation Age of Onset  . Diabetes Paternal Grandmother   . Diabetes Paternal Grandfather   . Arthritis Mother   . Lupus Mother   . Alcohol abuse Neg Hx   . Asthma Neg Hx   . Birth defects Neg Hx   . Cancer Neg Hx   . COPD Neg Hx   . Depression Neg Hx   . Drug abuse Neg Hx   . Early death Neg Hx   . Hearing loss Neg Hx   . Heart disease Neg Hx   . Hyperlipidemia Neg Hx   . Hypertension Neg Hx   . Kidney disease Neg Hx   . Learning disabilities Neg Hx   . Mental illness Neg Hx   . Mental retardation Neg Hx   . Miscarriages / Stillbirths Neg Hx   . Stroke Neg Hx   . Vision loss Neg Hx   . Varicose Veins Neg Hx    Social History:  reports that she has never smoked. She has never used smokeless tobacco. She  reports that she does not drink alcohol and does not use drugs.  Allergies:  Allergies  Allergen Reactions  . Latex Hives    Medications Prior to Admission  Medication Sig Dispense Refill  . cephALEXin (KEFLEX) 500 MG capsule Take 1 capsule (500 mg total) by mouth 2 (two) times daily. 28 capsule 0  . Doxylamine-Pyridoxine (DICLEGIS) 10-10 MG TBEC Take 2 tablets by mouth at bedtime. If symptoms persist, add one tablet in the morning and one in the afternoon 100 tablet 5  . famotidine (PEPCID) 40 MG tablet Take 40 mg by mouth daily.    . ondansetron (ZOFRAN-ODT) 4 MG disintegrating tablet Take 1 tablet (4 mg total) by mouth every 8 (eight) hours as needed for nausea or vomiting. 40 tablet 0  . Prenatal Vit-Fe Fumarate-FA (PRENATAL MULTIVITAMIN) TABS tablet Take 1 tablet by mouth daily.       No results found for this or any previous visit (from the past 48 hour(s)). No results found.  Review of Systems  Constitutional: Negative for chills and fever.  HENT:  Negative for congestion, hearing loss and sinus pain.   Respiratory: Negative for cough, shortness of breath and wheezing.   Cardiovascular: Negative for chest pain, palpitations and leg swelling.  Gastrointestinal: Negative for abdominal pain, constipation, diarrhea, nausea and vomiting.  Genitourinary: Negative for dysuria, flank pain, frequency, hematuria and urgency.  Musculoskeletal: Negative for back pain.  Skin: Negative for rash.  Neurological: Negative for dizziness and headaches.  Psychiatric/Behavioral: Negative for suicidal ideas. The patient is not nervous/anxious.     Blood pressure 130/73, pulse 92, temperature 98.6 F (37 C), temperature source Oral, resp. rate 18, height 5\' 3"  (1.6 m), weight 76.2 kg, last menstrual period 07/18/2019, unknown if currently breastfeeding. Physical Exam Vitals and nursing note reviewed.  Constitutional:      Appearance: She is well-developed and well-nourished.  HENT:     Head: Normocephalic and atraumatic.  Cardiovascular:     Rate and Rhythm: Normal rate and regular rhythm.  Pulmonary:     Effort: Pulmonary effort is normal.     Breath sounds: Normal breath sounds.  Abdominal:     General: Bowel sounds are normal.     Palpations: Abdomen is soft.  Musculoskeletal:        General: Normal range of motion.  Skin:    General: Skin is warm and dry.  Neurological:     Mental Status: She is alert and oriented to person, place, and time.  Psychiatric:        Mood and Affect: Mood and affect normal.        Behavior: Behavior normal.        Thought Content: Thought content normal.        Judgment: Judgment normal.    SVE by nursing: 6.5/80/-1  NST: 135 bpm baseline, moderate variability, 15x15 accelerations, no decelerations. Tocometer: every 2-8 minutes  Assessment/Plan 25 yo G2P1001 [redacted]w[redacted]d Will admit for labor and TOLAC. Discussed plan of care.  Minimal intervention. Will avoid augmentation given her early gestational age.   Epidural if desired.  GBS negative Routine labs for labor.    [redacted]w[redacted]d, MD 04/01/2020, 6:35 AM

## 2020-04-01 NOTE — OB Triage Note (Signed)
Pt Jordan Wallace 24 y.o. presents to the ED complaining of contractions every 4 minutes and perineal pressure. Pt is a G2P1001 at [redacted]w[redacted]d. Pt denies signs and symptons consistent with rupture of membranes or active vaginal bleeding. Pt states positive fetal movement. External FM and TOCO applied to non-tender abdomen and assessing. Initial FHR is 145. Vital signs obtained and within normal limits. Provider notified of pt.

## 2020-04-01 NOTE — Progress Notes (Signed)
Labor Check  Subj:  Complaints: none. Comfortable with epidural   Obj:  BP 135/72   Pulse 92   Temp 98.2 F (36.8 C) (Oral)   Resp 16   Ht 5\' 3"  (1.6 m)   Wt 76.2 kg   LMP 07/18/2019   SpO2 98%   BMI 29.76 kg/m     Cervix: Dilation: 6.5 / Effacement (%): 70 / Station: -1   AROM -clear fluid Baseline FHR: 150 beats/min   Variability: moderate   Accelerations: present   Decelerations: absent Contractions: present frequency: not tracing well Overall assessment: cat 1  Female chaperone present for pelvic exam:   A/P: 25 y.o. G33P1001 female at [redacted]w[redacted]d with advanced cervical dilation with history of c-section.  Augmenting labor given advanced cervical dilation with continued contractions.   1.  Labor: AROM, will augment with pitocin, if necessary  2.  FWB: reassuring, Overall assessment: category 1  3.  GBS negative  4.  Pain: epidural 5.  Recheck: 2 hours or PRN    [redacted]w[redacted]d, MD, Thomasene Mohair OB/GYN, [facility] 04/01/2020 8:51 PM

## 2020-04-01 NOTE — Anesthesia Preprocedure Evaluation (Signed)
Anesthesia Evaluation  Patient identified by MRN, date of birth, ID band Patient awake    Reviewed: Allergy & Precautions, H&P , NPO status , Patient's Chart, lab work & pertinent test results, reviewed documented beta blocker date and time   History of Anesthesia Complications Negative for: history of anesthetic complications  Airway Mallampati: II  TM Distance: >3 FB Neck ROM: full    Dental no notable dental hx.    Pulmonary neg pulmonary ROS,    Pulmonary exam normal breath sounds clear to auscultation       Cardiovascular Exercise Tolerance: Good negative cardio ROS Normal cardiovascular exam Rhythm:regular Rate:Normal     Neuro/Psych PSYCHIATRIC DISORDERS Anxiety Depression negative neurological ROS     GI/Hepatic negative GI ROS, Neg liver ROS,   Endo/Other  negative endocrine ROS  Renal/GU Renal disease (kidney stone)  negative genitourinary   Musculoskeletal   Abdominal   Peds  Hematology  (+) Blood dyscrasia, anemia ,   Anesthesia Other Findings Past Medical History: No date: Anemia No date: Depression     Comment:  doing ok now No date: Infection     Comment:  UTI   Reproductive/Obstetrics (+) Pregnancy                             Anesthesia Physical Anesthesia Plan  ASA: II  Anesthesia Plan: Epidural   Post-op Pain Management:    Induction:   PONV Risk Score and Plan:   Airway Management Planned:   Additional Equipment:   Intra-op Plan:   Post-operative Plan:   Informed Consent: I have reviewed the patients History and Physical, chart, labs and discussed the procedure including the risks, benefits and alternatives for the proposed anesthesia with the patient or authorized representative who has indicated his/her understanding and acceptance.     Dental Advisory Given  Plan Discussed with: Anesthesiologist, CRNA and Surgeon  Anesthesia Plan Comments:          Anesthesia Quick Evaluation

## 2020-04-01 NOTE — Anesthesia Procedure Notes (Signed)
Epidural Patient location during procedure: OB Start time: 04/01/2020 7:58 PM End time: 04/01/2020 8:01 PM  Staffing Anesthesiologist: Lenard Simmer, MD Performed: anesthesiologist   Preanesthetic Checklist Completed: patient identified, IV checked, site marked, risks and benefits discussed, surgical consent, monitors and equipment checked, pre-op evaluation and timeout performed  Epidural Patient position: sitting Prep: ChloraPrep Patient monitoring: heart rate, continuous pulse ox and blood pressure Approach: midline Location: L3-L4 Injection technique: LOR saline  Needle:  Needle type: Tuohy  Needle gauge: 17 G Needle length: 9 cm and 9 Needle insertion depth: 4 cm Catheter type: closed end flexible Catheter size: 19 Gauge Catheter at skin depth: 9 cm Test dose: negative and 1.5% lidocaine with Epi 1:200 K  Assessment Sensory level: T10 Events: blood not aspirated, injection not painful, no injection resistance, no paresthesia and negative IV test  Additional Notes 1st attempt Pt. Evaluated and documentation done after procedure finished. Patient identified. Risks/Benefits/Options discussed with patient including but not limited to bleeding, infection, nerve damage, paralysis, failed block, incomplete pain control, headache, blood pressure changes, nausea, vomiting, reactions to medication both or allergic, itching and postpartum back pain. Confirmed with bedside nurse the patient's most recent platelet count. Confirmed with patient that they are not currently taking any anticoagulation, have any bleeding history or any family history of bleeding disorders. Patient expressed understanding and wished to proceed. All questions were answered. Sterile technique was used throughout the entire procedure. Please see nursing notes for vital signs. Test dose was given through epidural catheter and negative prior to continuing to dose epidural or start infusion. Warning signs of high block  given to the patient including shortness of breath, tingling/numbness in hands, complete motor block, or any concerning symptoms with instructions to call for help. Patient was given instructions on fall risk and not to get out of bed. All questions and concerns addressed with instructions to call with any issues or inadequate analgesia.   Patient tolerated the insertion well without immediate complications.Reason for block:procedure for pain

## 2020-04-01 NOTE — Progress Notes (Signed)
Labor Progress Note  Jordan Wallace is a 25 y.o. G2P1001 at [redacted]w[redacted]d by LMP consistent with 9w [place] admitted for active labor and TOLAC.  Subjective: Patient is resting in bed. Reports experiencing 8/10 pain with contractions but that contractions have spaced out. Patient is very tired with minimal rest overnight.  Objective: BP 128/80 (BP Location: Right Arm)   Pulse 88   Temp (!) 97.5 F (36.4 C) (Oral)   Resp 15   Ht 5\' 3"  (1.6 m)   Wt 76.2 kg   LMP 07/18/2019   BMI 29.76 kg/m  Notable VS details: wnl  Fetal Assessment: FHT:  FHR: 135 bpm, variability: moderate,  accelerations:  Present,  decelerations:  Absent Category/reactivity:  Category I UC:   irregular, every 7-14 minutes (have space out significant in the last hour) SVE:  5-6cm/70%/-2 Membrane status: intact Amniotic color: n/a  Labs: Lab Results  Component Value Date   WBC 13.2 (H) 04/01/2020   HGB 11.2 (L) 04/01/2020   HCT 33.4 (L) 04/01/2020   MCV 89.3 04/01/2020   PLT 139 (L) 04/01/2020    Assessment / Plan: Early active labor - TOLAC  Labor: Currently no change from previous exam, reviewed limitations for augmentation with patient due to gestational age.  Preeclampsia:  No current S&S of PIH Fetal Wellbeing:  Category I Pain Control:  Labor support without medications I/D:  GBS negative Anticipated MOD:  VBAC - will continue to monitor labor progress.  05/30/2020, CNM 04/01/2020, 11:49 AM

## 2020-04-01 NOTE — Progress Notes (Signed)
Patient has had several cervical exams. She continues to contract. She is now 6-7 cm.  She had an exam by an RN earlier that was 6.5 cm, then had an exam that was called 5 cm.   In either case, we discussed the likelihood of progression of labor in the next 24 hours being very high.  Discharge home and her laboring at home with a uterine scar is risky and undesirable.   The fetal station has changed and so she is laboring, albeit slowly. With no evident benefit of sending her home with high likelihood of labor at home I believe it is overall safer to move to delivery with her now. We discussed the risks of prematurity at [redacted]w[redacted]d vs [redacted]w[redacted]d, when she will likely deliver.   In this case, I believe the benefits of laboring at home given her advanced cervical dilation and continued contractions are low and the risks are high and higher than the risk of delivery at this time.  We discussed the risk of prematurity and she agrees to proceed with gentle augmentation at this point. She is GBS negative. Will get her en epidural and then perform AROM.  We discussed a low threshold to move to c-section for any concerns of uterine rupture or fetal intolerance. She voiced agreement and understanding.    Thomasene Mohair, MD, Merlinda Frederick OB/GYN, [facility] 04/01/2020 7:27 PM

## 2020-04-02 ENCOUNTER — Encounter: Payer: No Typology Code available for payment source | Admitting: Obstetrics & Gynecology

## 2020-04-02 ENCOUNTER — Encounter: Payer: Self-pay | Admitting: Obstetrics and Gynecology

## 2020-04-02 DIAGNOSIS — Z3A36 36 weeks gestation of pregnancy: Secondary | ICD-10-CM

## 2020-04-02 DIAGNOSIS — O34219 Maternal care for unspecified type scar from previous cesarean delivery: Secondary | ICD-10-CM

## 2020-04-02 LAB — RPR: RPR Ser Ql: NONREACTIVE

## 2020-04-02 MED ORDER — OXYTOCIN-SODIUM CHLORIDE 30-0.9 UT/500ML-% IV SOLN
INTRAVENOUS | Status: AC
  Filled 2020-04-02: qty 500

## 2020-04-02 MED ORDER — IBUPROFEN 600 MG PO TABS
600.0000 mg | ORAL_TABLET | Freq: Four times a day (QID) | ORAL | Status: DC
  Administered 2020-04-02 – 2020-04-03 (×6): 600 mg via ORAL
  Filled 2020-04-02 (×7): qty 1

## 2020-04-02 MED ORDER — SENNOSIDES-DOCUSATE SODIUM 8.6-50 MG PO TABS
2.0000 | ORAL_TABLET | ORAL | Status: DC
  Administered 2020-04-02: 2 via ORAL
  Filled 2020-04-02: qty 2

## 2020-04-02 MED ORDER — DIBUCAINE (PERIANAL) 1 % EX OINT
1.0000 "application " | TOPICAL_OINTMENT | CUTANEOUS | Status: DC | PRN
  Administered 2020-04-02: 1 via RECTAL
  Filled 2020-04-02: qty 28

## 2020-04-02 MED ORDER — DIPHENHYDRAMINE HCL 25 MG PO CAPS: 25.0000 mg | ORAL_CAPSULE | Freq: Four times a day (QID) | ORAL | Status: DC | PRN

## 2020-04-02 MED ORDER — WITCH HAZEL-GLYCERIN EX PADS: 1.0000 "application " | MEDICATED_PAD | CUTANEOUS | Status: DC | PRN

## 2020-04-02 MED ORDER — BENZOCAINE-MENTHOL 20-0.5 % EX AERO
INHALATION_SPRAY | CUTANEOUS | Status: AC
  Filled 2020-04-02: qty 56

## 2020-04-02 MED ORDER — BENZOCAINE-MENTHOL 20-0.5 % EX AERO: 1.0000 "application " | INHALATION_SPRAY | CUTANEOUS | Status: DC | PRN

## 2020-04-02 MED ORDER — ACETAMINOPHEN 325 MG PO TABS
650.0000 mg | ORAL_TABLET | ORAL | Status: DC | PRN
  Administered 2020-04-02: 650 mg via ORAL
  Filled 2020-04-02: qty 2

## 2020-04-02 MED ORDER — COCONUT OIL OIL
1.0000 "application " | TOPICAL_OIL | Status: DC | PRN
  Administered 2020-04-02: 1 via TOPICAL
  Filled 2020-04-02: qty 120

## 2020-04-02 MED ORDER — ONDANSETRON HCL 4 MG PO TABS
4.0000 mg | ORAL_TABLET | ORAL | Status: DC | PRN
  Filled 2020-04-02: qty 1

## 2020-04-02 MED ORDER — PRENATAL MULTIVITAMIN CH
1.0000 | ORAL_TABLET | Freq: Every day | ORAL | Status: DC
  Administered 2020-04-02 – 2020-04-03 (×2): 1 via ORAL
  Filled 2020-04-02 (×2): qty 1

## 2020-04-02 MED ORDER — HYDROCODONE-ACETAMINOPHEN 5-325 MG PO TABS
1.0000 | ORAL_TABLET | Freq: Four times a day (QID) | ORAL | Status: DC | PRN
  Administered 2020-04-02 (×3): 1 via ORAL
  Filled 2020-04-02 (×3): qty 1

## 2020-04-02 MED ORDER — SIMETHICONE 80 MG PO CHEW
80.0000 mg | CHEWABLE_TABLET | ORAL | Status: DC | PRN
  Administered 2020-04-02: 80 mg via ORAL
  Filled 2020-04-02: qty 1

## 2020-04-02 MED ORDER — FERROUS SULFATE 325 (65 FE) MG PO TABS
325.0000 mg | ORAL_TABLET | Freq: Two times a day (BID) | ORAL | Status: DC
  Administered 2020-04-02 – 2020-04-03 (×3): 325 mg via ORAL
  Filled 2020-04-02 (×3): qty 1

## 2020-04-02 MED ORDER — ONDANSETRON HCL 4 MG/2ML IJ SOLN: 4.0000 mg | INTRAMUSCULAR | Status: DC | PRN

## 2020-04-02 NOTE — Discharge Summary (Signed)
Postpartum Discharge Summary   Patient Name: Jordan Wallace DOB: 01-10-1996 MRN: [id number]  Date of admission: 04/01/2020 Delivery date:04/01/2020  Delivering provider: Prentice Docker D  Date of discharge: 04/03/2020  Admitting diagnosis: [redacted] weeks gestation of pregnancy [Z3A.36]  Preterm labor in third trimester Intrauterine pregnancy: [redacted]w[redacted]d    Secondary diagnosis:  Active Problems:   History of cesarean delivery, antepartum   Supervision of high risk pregnancy, antepartum   Preterm labor in third trimester with preterm delivery   Encounter for postpartum care and examination after delivery   Care and examination of lactating mother  Additional problems: none    Discharge diagnosis: Preterm Pregnancy Delivered and VBAC                                              Post partum procedures:none Augmentation: AROM Complications: None  Hospital course: Onset of Labor With Vaginal Delivery      25y.o. yo [id number] admitted in Active Labor on 04/01/2020. Patient had an uncomplicated labor course as follows:  Membrane Rupture Time/Date: 8:48 PM ,04/01/2020   Delivery Method:VBAC, Spontaneous  Episiotomy: None  Lacerations:  2nd degree;Vaginal;Perineal  Patient had an uncomplicated postpartum course.  She is ambulating, tolerating a regular diet, passing flatus, and urinating well. Patient is discharged home in stable condition on 04/03/20.  Newborn Data: Birth date:04/01/2020  Birth time:11:30 PM  Gender:Female  Living status:Living  Apgars:8 ,9  Weight:3090 g   Magnesium Sulfate received: No BMZ received: Yes (previous admission) Rhophylac:N/A MMR:No T-DaP:Given postpartum Flu: Yes Transfusion:No  Physical exam  Vitals:   04/02/20 1943 04/03/20 0027 04/03/20 0315 04/03/20 0759  BP: 106/61 117/66 116/62 119/82  Pulse: 95 86 70 71  Resp: 18 18 18 18   Temp: 98 F (36.7 C) 97.9 F (36.6 C) 98.1 F (36.7 C) 98.1 F (36.7 C)  TempSrc: Oral Oral Oral Oral   SpO2: 100% 100% 99% 100%  Weight:      Height:       General: alert, cooperative and no distress Lochia: appropriate Uterine Fundus: firm Incision: n/a DVT Evaluation: No evidence of DVT seen on physical exam. No significant calf/ankle edema. Labs: Lab Results  Component Value Date   WBC 13.4 (H) 04/03/2020   HGB 8.6 (L) 04/03/2020   HCT 26.1 (L) 04/03/2020   MCV 91.9 04/03/2020   PLT 124 (L) 04/03/2020   CMP Latest Ref Rng & Units 02/11/2020  Glucose 70 - 99 mg/dL 85  BUN 6 - 20 mg/dL 6  Creatinine 0.44 - 1.00 mg/dL 0.59  Sodium 135 - 145 mmol/L 134(L)  Potassium 3.5 - 5.1 mmol/L 3.3(L)  Chloride 98 - 111 mmol/L 105  CO2 22 - 32 mmol/L 16(L)  Calcium 8.9 - 10.3 mg/dL 8.8(L)  Total Protein 6.5 - 8.1 g/dL -  Total Bilirubin 0.3 - 1.2 mg/dL -  Alkaline Phos 38 - 126 U/L -  AST 15 - 41 U/L -  ALT 0 - 44 U/L -   Edinburgh Score: Edinburgh Postnatal Depression Scale Screening Tool 04/02/2020  I have been able to laugh and see the funny side of things. 0  I have looked forward with enjoyment to things. 0  I have blamed myself unnecessarily when things went wrong. 1  I have been anxious or worried for no good reason. 2  I have felt scared or  panicky for no good reason. 0  Things have been getting on top of me. 2  I have been so unhappy that I have had difficulty sleeping. 0  I have felt sad or miserable. 0  I have been so unhappy that I have been crying. 0  The thought of harming myself has occurred to me. 0  Edinburgh Postnatal Depression Scale Total 5      After visit meds:  Allergies as of 04/03/2020      Reactions   Latex Hives      Medication List    STOP taking these medications   cephALEXin 500 MG capsule Commonly known as: KEFLEX   Doxylamine-Pyridoxine 10-10 MG Tbec Commonly known as: Diclegis   famotidine 40 MG tablet Commonly known as: PEPCID   ondansetron 4 MG disintegrating tablet Commonly known as: ZOFRAN-ODT     TAKE these medications    acetaminophen 325 MG tablet Commonly known as: Tylenol Take 2 tablets (650 mg total) by mouth every 4 (four) hours as needed (for pain scale < 4).   ferrous sulfate 325 (65 FE) MG tablet Take 1 tablet (325 mg total) by mouth every other day.   ibuprofen 600 MG tablet Commonly known as: ADVIL Take 1 tablet (600 mg total) by mouth every 6 (six) hours.   prenatal multivitamin Tabs tablet Take 1 tablet by mouth daily.        Discharge home in stable condition Infant Feeding: Breast Discharge instruction: per After Visit Summary and Postpartum booklet. Activity: Advance as tolerated. Pelvic rest for 6 weeks.  Diet: routine diet Anticipated Birth Control: Unsure Postpartum Appointment:2 weeks Additional Postpartum F/U: Postpartum Depression checkup Future Appointments: Future Appointments  Date Time Provider [place]  04/09/2020  8:00 AM [facility] PAT1 [facility] None  04/15/2020  8:05 AM [facility] None  04/17/2020 11:30 AM Will Bonnet, MD [facility] None   Follow up Visit:  Follow-up Information    Will Bonnet, MD. Go on 04/17/2020.   Specialty: Obstetrics and Gynecology Why: Postpartum depression check January 21st at 11:30am Contact information: [address] [place] [address] [phone]               SIGNED: Orlie Pollen, CNM, MSN

## 2020-04-02 NOTE — Progress Notes (Signed)
Post Partum Day 0 Subjective: no complaints, up ad lib, voiding, tolerating PO and shye did have a dizzy episode at approximately 0300 this morning when getting up. Did not faint, but felt very tired and "heavy" Denies this at present. Using Motrin and one Vicodin for afterpains.  Objective: Blood pressure (!) 111/51, pulse 65, temperature 98.1 F (36.7 C), temperature source Oral, resp. rate 15, height 5\' 3"  (1.6 m), weight 76.2 kg, last menstrual period 07/18/2019, SpO2 98 %, unknown if currently breastfeeding.  Physical Exam:  General: alert, cooperative and no distress Lochia: appropriate Uterine Fundus: firm Incision: healing well, no significant drainage, mild perineal edema. Ice pack in place DVT Evaluation: No evidence of DVT seen on physical exam. Negative Homan's sign.  Recent Labs    04/01/20 0635  HGB 11.2*  HCT 33.4*    Assessment/Plan: Plan for discharge tomorrow, Breastfeeding, Lactation consult and Contraception considering IUD.   LOS: 1 day   05/30/20 04/02/2020, 9:09 AM

## 2020-04-02 NOTE — Progress Notes (Signed)
RN assisted patient to the restroom to empty her bladder. Patient reported that she felt fine sitting at the edge of the bed, standing, and walking to the toilet. When she sat on the toilet, she reported that she felt light-headed, woozy, and as if she could pass out. RN called out for additional help. 2 RNs assisted patient back to bed. Vitals checked and within normal limits. Will continue to monitor.

## 2020-04-03 LAB — CBC
HCT: 26.1 % — ABNORMAL LOW (ref 36.0–46.0)
Hemoglobin: 8.6 g/dL — ABNORMAL LOW (ref 12.0–15.0)
MCH: 30.3 pg (ref 26.0–34.0)
MCHC: 33 g/dL (ref 30.0–36.0)
MCV: 91.9 fL (ref 80.0–100.0)
Platelets: 124 10*3/uL — ABNORMAL LOW (ref 150–400)
RBC: 2.84 MIL/uL — ABNORMAL LOW (ref 3.87–5.11)
RDW: 13 % (ref 11.5–15.5)
WBC: 13.4 10*3/uL — ABNORMAL HIGH (ref 4.0–10.5)
nRBC: 0 % (ref 0.0–0.2)

## 2020-04-03 MED ORDER — IBUPROFEN 600 MG PO TABS: 600.0000 mg | ORAL_TABLET | Freq: Four times a day (QID) | ORAL | 0 refills | Status: DC

## 2020-04-03 MED ORDER — FERROUS SULFATE 325 (65 FE) MG PO TABS: 325.0000 mg | ORAL_TABLET | ORAL | 3 refills | Status: DC

## 2020-04-03 MED ORDER — ACETAMINOPHEN 325 MG PO TABS: 650.0000 mg | ORAL_TABLET | ORAL | Status: DC | PRN

## 2020-04-03 MED ORDER — TETANUS-DIPHTH-ACELL PERTUSSIS 5-2.5-18.5 LF-MCG/0.5 IM SUSY
0.5000 mL | PREFILLED_SYRINGE | Freq: Once | INTRAMUSCULAR | Status: AC
  Administered 2020-04-03: 0.5 mL via INTRAMUSCULAR
  Filled 2020-04-03: qty 0.5

## 2020-04-03 NOTE — Lactation Note (Addendum)
This note was copied from a baby's chart. Lactation Consultation Note  Patient Name: Jordan Wallace Date: 04/03/2020 Reason for consult: Follow-up assessment;Late-preterm 34-36.6wks Age:25 hours  Maternal Data Has patient been taught Hand Expression?: Yes Does the patient have breastfeeding experience prior to this delivery?: Yes Mom reports baby fussy at breast  Mom has [employer] insurance, desires Medela Sonata breast pump, this was given to her and charted on employee breast pump flowsheet  Feeding Feeding Type: Breast Fed Baby nursing on right breast when room entered, continued approx. 10 min, came off and began crying when position changed, arches back, can't coordinate tongue despite rooting, unable to latch on this breast again, mom turned to left side to latch on this breast, left nipple more flat but will evert with stimulation, mom states that this breast was more flat with first child, baby fussy on this side also, will latch with sandwiching breast but pulls off and cries, lab in to draw bili, after this was done, baby swaddled and calmed, placed in side lying position on right breast with mom in right side lying position, still has trouble coordinating tongue but will latch and suck and come off and then relatch, swallows heard when baby calms and remains latched, mom reported baby nursed approx. 15 min on this breast, stopping and coming off but not fussy and relatched self.  Mom reports that baby has had gagging and swallowing mucous since birth, only 2-3 stools since birth, also late preterm may explain inability to coordinate suck at times.      LATCH Score Latch: Repeated attempts needed to sustain latch, nipple held in mouth throughout feeding, stimulation needed to elicit sucking reflex. (fussy baby at breast)  Audible Swallowing: Spontaneous and intermittent  Type of Nipple: Flat (flat on left but everts with stim)  Comfort (Breast/Nipple): Soft /  non-tender  Hold (Positioning): Assistance needed to correctly position infant at breast and maintain latch.  LATCH Score: 7  Interventions Interventions: Breast feeding basics reviewed;Assisted with latch;Breast massage;Hand express;Breast compression;Adjust position;Support pillows  Lactation Tools Discussed/Used WIC Program: No LC name and no written on white board  Consult Status Consult Status: PRN    Dyann Kief 04/03/2020, 1:35 PM

## 2020-04-03 NOTE — Progress Notes (Signed)
Pt discharged with infant. Discharge instructions, prescriptions, and follow up appointments given to and reviewed with patient. Pt verbalized understanding. To be escorted out by auxillary.  °

## 2020-04-03 NOTE — Anesthesia Postprocedure Evaluation (Signed)
Anesthesia Post Note  Patient: Jordan Wallace  Procedure(s) Performed: AN AD HOC LABOR EPIDURAL  Patient location during evaluation: Mother Baby Anesthesia Type: Epidural Level of consciousness: oriented and awake and alert Pain management: pain level controlled Vital Signs Assessment: post-procedure vital signs reviewed and stable Respiratory status: spontaneous breathing and respiratory function stable Cardiovascular status: blood pressure returned to baseline and stable Postop Assessment: no headache, no backache, no apparent nausea or vomiting and able to ambulate Anesthetic complications: no   No complications documented.   Last Vitals:  Vitals:   04/03/20 0315 04/03/20 0759  BP: 116/62 119/82  Pulse: 70 71  Resp: 18 18  Temp: 36.7 C 36.7 C  SpO2: 99% 100%    Last Pain:  Vitals:   04/03/20 0759  TempSrc: Oral  PainSc:                  [name]

## 2020-04-03 NOTE — Discharge Instructions (Signed)

## 2020-04-08 ENCOUNTER — Encounter: Payer: No Typology Code available for payment source | Admitting: Obstetrics & Gynecology

## 2020-04-09 ENCOUNTER — Inpatient Hospital Stay: Admission: RE | Admit: 2020-04-09 | Payer: No Typology Code available for payment source | Source: Ambulatory Visit

## 2020-04-15 ENCOUNTER — Other Ambulatory Visit: Payer: No Typology Code available for payment source

## 2020-04-16 ENCOUNTER — Inpatient Hospital Stay: Admit: 2020-04-16 | Payer: No Typology Code available for payment source | Admitting: Obstetrics & Gynecology

## 2020-04-16 SURGERY — Surgical Case
Anesthesia: Choice

## 2020-04-17 ENCOUNTER — Ambulatory Visit: Payer: No Typology Code available for payment source | Admitting: Obstetrics and Gynecology

## 2020-04-21 ENCOUNTER — Ambulatory Visit (INDEPENDENT_AMBULATORY_CARE_PROVIDER_SITE_OTHER): Payer: No Typology Code available for payment source | Admitting: Obstetrics and Gynecology

## 2020-04-21 ENCOUNTER — Other Ambulatory Visit: Payer: Self-pay

## 2020-04-21 ENCOUNTER — Encounter: Payer: Self-pay | Admitting: Obstetrics and Gynecology

## 2020-04-21 NOTE — Progress Notes (Signed)
Obstetrics & Gynecology Office Visit   Chief Complaint  Patient presents with  . Postpartum Care  . Follow-up   History of Present Illness: 25 y.o. G78P1102 female who is 19 days postpartum from a successful VBAC presents for postpartum depression check with history of depression.  Overall, she is doing well, though she does have some stressors. She denies SI/HI.  She scored 8 today on EPDS.  She is breastfeeding, giving some formula.   Past Medical History:  Diagnosis Date  . Anemia   . Depression    doing ok now  . Infection    UTI    Past Surgical History:  Procedure Laterality Date  . CESAREAN SECTION N/A 11/28/2014   Procedure: CESAREAN SECTION;  Surgeon: Myna Hidalgo, DO;  Location: [facility];  Service: Obstetrics;  Laterality: N/A;  . FASCIOTOMY     bilateral    Gynecologic History: No LMP recorded.  Obstetric History: [id number]  Family History  Problem Relation Age of Onset  . Diabetes Paternal Grandmother   . Diabetes Paternal Grandfather   . Arthritis Mother   . Lupus Mother   . Alcohol abuse Neg Hx   . Asthma Neg Hx   . Birth defects Neg Hx   . Cancer Neg Hx   . COPD Neg Hx   . Depression Neg Hx   . Drug abuse Neg Hx   . Early death Neg Hx   . Hearing loss Neg Hx   . Heart disease Neg Hx   . Hyperlipidemia Neg Hx   . Hypertension Neg Hx   . Kidney disease Neg Hx   . Learning disabilities Neg Hx   . Mental illness Neg Hx   . Mental retardation Neg Hx   . Miscarriages / Stillbirths Neg Hx   . Stroke Neg Hx   . Vision loss Neg Hx   . Varicose Veins Neg Hx     Social History   Socioeconomic History  . Marital status: Significant Other    Spouse name: Kendell Bane  . Number of children: Not on file  . Years of education: Not on file  . Highest education level: Not on file  Occupational History  . Occupation: Land: [employer]  Tobacco Use  . Smoking status: Never Smoker  . Smokeless tobacco: Never Used  Substance and Sexual  Activity  . Alcohol use: No  . Drug use: No  . Sexual activity: Yes    Birth control/protection: None  Other Topics Concern  . Not on file  Social History Narrative  . Not on file   Social Determinants of Health   Financial Resource Strain: Not on file  Food Insecurity: Not on file  Transportation Needs: Not on file  Physical Activity: Not on file  Stress: Not on file  Social Connections: Not on file  Intimate Partner Violence: Not on file    Allergies  Allergen Reactions  . Latex Hives    Prior to Admission medications   Medication Sig Start Date End Date Taking? Authorizing Provider  acetaminophen (TYLENOL) 325 MG tablet Take 2 tablets (650 mg total) by mouth every 4 (four) hours as needed (for pain scale < 4). 04/03/20   Zipporah Plants, CNM  ferrous sulfate 325 (65 FE) MG tablet Take 1 tablet (325 mg total) by mouth every other day. 04/03/20   Zipporah Plants, CNM  ibuprofen (ADVIL) 600 MG tablet Take 1 tablet (600 mg total) by mouth every 6 (six) hours. 04/03/20  Zipporah Plants, CNM  Prenatal Vit-Fe Fumarate-FA (PRENATAL MULTIVITAMIN) TABS tablet Take 1 tablet by mouth daily.     [provider]    Review of Systems  Constitutional: Negative.   HENT: Negative.   Eyes: Negative.   Respiratory: Negative.   Cardiovascular: Negative.   Gastrointestinal: Negative.   Genitourinary: Negative.   Musculoskeletal: Negative.   Skin: Negative.   Neurological: Negative.   Psychiatric/Behavioral: Negative.      Physical Exam BP 126/74   Ht 5\' 3"  (1.6 m)   Wt 150 lb (68 kg)   BMI 26.57 kg/m  No LMP recorded. Physical Exam Constitutional:      General: She is not in acute distress.    Appearance: Normal appearance. She is well-developed.  Genitourinary:     Bladder normal.     Pelvic Tanner Score: 5/5.  HENT:     Head: Normocephalic and atraumatic.  Eyes:     General: No scleral icterus.    Conjunctiva/sclera: Conjunctivae normal.  Musculoskeletal:         General: No swelling. Normal range of motion.  Neurological:     General: No focal deficit present.     Mental Status: She is alert and oriented to person, place, and time.     Cranial Nerves: No cranial nerve deficit.  Skin:    General: Skin is warm and dry.     Findings: No erythema or rash.  Psychiatric:        Mood and Affect: Mood normal.        Behavior: Behavior normal.        Judgment: Judgment normal.     Female chaperone present for pelvic and breast  portions of the physical exam  Assessment: 25 y.o. 25 female here for  1. Postpartum care and examination      Plan: Problem List Items Addressed This Visit   None   Visit Diagnoses    Postpartum care and examination    -  Primary     Negative screen for postpartum depression today.   Return in about 3 weeks (around 05/12/2020) for Six Week Postpartum.   05/14/2020, MD 04/21/2020 3:32 PM

## 2020-05-15 ENCOUNTER — Ambulatory Visit: Payer: No Typology Code available for payment source | Admitting: Obstetrics and Gynecology

## 2020-05-21 ENCOUNTER — Other Ambulatory Visit: Payer: Self-pay

## 2020-05-21 ENCOUNTER — Ambulatory Visit (INDEPENDENT_AMBULATORY_CARE_PROVIDER_SITE_OTHER): Payer: No Typology Code available for payment source | Admitting: Obstetrics and Gynecology

## 2020-05-21 DIAGNOSIS — Z30430 Encounter for insertion of intrauterine contraceptive device: Secondary | ICD-10-CM | POA: Diagnosis not present

## 2020-05-21 NOTE — Progress Notes (Signed)
Postpartum Visit   Chief Complaint  Patient presents with  . Postpartum Care   History of Present Illness: Patient is a 25 y.o. [id number] presents for postpartum visit.  Date of delivery: 04/02/2020 Type of delivery: Vaginal delivery (successful VBAC) - Vacuum or forceps assisted  no Episiotomy No.  Laceration: yes, 2nd degree vaginal/perineal Pregnancy or labor problems:  Preterm labor Any problems since the delivery:  no  Newborn Details:  SINGLETON :  1. Birth weight: 3,090 grams Maternal Details:  Breast Feeding:  no Post partum depression/anxiety noted:  no Edinburgh Post-Partum Depression Score:  4  Date of last PAP: 03/18/2020  normal   Past Medical History:  Diagnosis Date  . Anemia   . Depression    doing ok now  . Infection    UTI    Past Surgical History:  Procedure Laterality Date  . CESAREAN SECTION N/A 11/28/2014   Procedure: CESAREAN SECTION;  Surgeon: Jordan Hidalgo, DO;  Location: [facility];  Service: Obstetrics;  Laterality: N/A;  . FASCIOTOMY     bilateral    Prior to Admission medications   Medication Sig Start Date End Date Taking? Authorizing Provider  acetaminophen (TYLENOL) 325 MG tablet Take 2 tablets (650 mg total) by mouth every 4 (four) hours as needed (for pain scale < 4). 04/03/20   Jordan Wallace, CNM  ferrous sulfate 325 (65 FE) MG tablet Take 1 tablet (325 mg total) by mouth every other day. 04/03/20   Jordan Wallace, CNM  ibuprofen (ADVIL) 600 MG tablet Take 1 tablet (600 mg total) by mouth every 6 (six) hours. 04/03/20   Jordan Wallace, CNM  Prenatal Vit-Fe Fumarate-FA (PRENATAL MULTIVITAMIN) TABS tablet Take 1 tablet by mouth daily.     [provider]    Allergies  Allergen Reactions  . Latex Hives     Social History   Socioeconomic History  . Marital status: Significant Other    Spouse name: Jordan Wallace  . Number of children: Not on file  . Years of education: Not on file  . Highest education level: Not on file  Occupational  History  . Occupation: Land: [employer]  Tobacco Use  . Smoking status: Never Smoker  . Smokeless tobacco: Never Used  Substance and Sexual Activity  . Alcohol use: No  . Drug use: No  . Sexual activity: Yes    Birth control/protection: None  Other Topics Concern  . Not on file  Social History Narrative  . Not on file   Social Determinants of Health   Financial Resource Strain: Not on file  Food Insecurity: Not on file  Transportation Needs: Not on file  Physical Activity: Not on file  Stress: Not on file  Social Connections: Not on file  Intimate Partner Violence: Not on file    Family History  Problem Relation Age of Onset  . Diabetes Paternal Grandmother   . Diabetes Paternal Grandfather   . Arthritis Mother   . Lupus Mother   . Alcohol abuse Neg Hx   . Asthma Neg Hx   . Birth defects Neg Hx   . Cancer Neg Hx   . COPD Neg Hx   . Depression Neg Hx   . Drug abuse Neg Hx   . Early death Neg Hx   . Hearing loss Neg Hx   . Heart disease Neg Hx   . Hyperlipidemia Neg Hx   . Hypertension Neg Hx   . Kidney disease Neg Hx   .  Learning disabilities Neg Hx   . Mental illness Neg Hx   . Mental retardation Neg Hx   . Miscarriages / Stillbirths Neg Hx   . Stroke Neg Hx   . Vision loss Neg Hx   . Varicose Veins Neg Hx     Review of Systems  Constitutional: Negative.   HENT: Negative.   Eyes: Negative.   Respiratory: Negative.   Cardiovascular: Negative.   Gastrointestinal: Negative.   Genitourinary: Negative.   Musculoskeletal: Negative.   Skin: Negative.   Neurological: Negative.   Psychiatric/Behavioral: Negative.      Physical Exam BP 118/74   Wt 150 lb (68 kg)   BMI 26.57 kg/m   Physical Exam Constitutional:      General: She is not in acute distress.    Appearance: Normal appearance. She is well-developed.  Genitourinary:     Vulva, bladder and urethral meatus normal.     Right Labia: No rash, tenderness, lesions, skin  changes or Bartholin's cyst.    Left Labia: No tenderness, skin changes, Bartholin's cyst or rash.    No inguinal adenopathy present in the right or left side.    Pelvic Tanner Score: 5/5.     Right Adnexa: not tender, not full and no mass present.    Left Adnexa: not tender, not full and no mass present.    No cervical motion tenderness, friability, lesion or polyp.     Uterus is not enlarged, fixed or tender.     Uterus is anteverted.     No urethral tenderness or mass present.     Pelvic exam was performed with patient in the lithotomy position.  HENT:     Head: Normocephalic and atraumatic.  Eyes:     General: No scleral icterus.    Conjunctiva/sclera: Conjunctivae normal.  Cardiovascular:     Rate and Rhythm: Normal rate and regular rhythm.     Heart sounds: No murmur heard. No friction rub. No gallop.   Pulmonary:     Effort: Pulmonary effort is normal. No respiratory distress.     Breath sounds: Normal breath sounds. No wheezing or rales.  Abdominal:     General: Bowel sounds are normal. There is no distension.     Palpations: Abdomen is soft. There is no mass.     Tenderness: There is no abdominal tenderness. There is no guarding or rebound.     Hernia: There is no hernia in the left inguinal area or right inguinal area.  Musculoskeletal:        General: Normal range of motion.     Cervical back: Normal range of motion and neck supple.  Lymphadenopathy:     Lower Body: No right inguinal adenopathy. No left inguinal adenopathy.  Neurological:     General: No focal deficit present.     Mental Status: She is alert and oriented to person, place, and time.     Cranial Nerves: No cranial nerve deficit.  Skin:    General: Skin is warm and dry.     Findings: No erythema.  Psychiatric:        Mood and Affect: Mood normal.        Behavior: Behavior normal.        Judgment: Judgment normal.      IUD Insertion Procedure Note (Paragard) Patient identified, informed consent  performed, consent signed.   Discussed risks of irregular bleeding, cramping, infection, malpositioning, expulsion or uterine perforation of the IUD (1:1000 placements)  which may require  further procedure such as laparoscopy.  IUD while effective at preventing pregnancy do not prevent transmission of sexually transmitted diseases and use of barrier methods for this purpose was discussed. Time out was performed.  Urine pregnancy test negative.  Speculum placed in the vagina.  Cervix visualized.  Cleaned with Betadine x 2.  Grasped anteriorly with a single tooth tenaculum.  Uterus sounded to 8.5 cm. IUD placed per manufacturer's recommendations.  Strings trimmed to 3 cm. Tenaculum was removed, good hemostasis noted.  Patient tolerated procedure well.   Patient was given post-procedure instructions.  She was advised to have backup contraception for one week.  Patient was also asked to check IUD strings periodically and follow up in 4 weeks for IUD check.   Female Chaperone present during breast and/or pelvic exam.  Assessment: 25 y.o. [id number] presenting for 6 week postpartum visit  Plan: Problem List Items Addressed This Visit      Other   Encounter for postpartum care and examination after delivery    Other Visit Diagnoses    Postpartum care and examination    -  Primary   Relevant Medications   paragard intrauterine copper IUD IUD   Encounter for IUD insertion       Relevant Medications   paragard intrauterine copper IUD IUD     1) Contraception: Paragard IUD insertion.  2)  Pap - ASCCP guidelines and rational discussed.  Patient opts for routine screening interval  3) Patient underwent screening for postpartum depression with no concerns noted.  Return in about 4 weeks (around 06/18/2020) for IUD String Check.   Thomasene Mohair, MD 05/22/2020 8:09 AM

## 2020-05-22 ENCOUNTER — Encounter: Payer: Self-pay | Admitting: Obstetrics and Gynecology

## 2020-05-22 MED ORDER — PARAGARD INTRAUTERINE COPPER IU IUD: 1.0000 | INTRAUTERINE_SYSTEM | Freq: Once | INTRAUTERINE | 0 refills | Status: AC

## 2020-06-22 ENCOUNTER — Ambulatory Visit: Payer: No Typology Code available for payment source | Admitting: Obstetrics and Gynecology

## 2020-06-30 ENCOUNTER — Other Ambulatory Visit: Payer: Self-pay

## 2020-07-28 ENCOUNTER — Emergency Department (HOSPITAL_COMMUNITY): Payer: No Typology Code available for payment source

## 2020-07-28 ENCOUNTER — Emergency Department (HOSPITAL_COMMUNITY)
Admission: EM | Admit: 2020-07-28 | Discharge: 2020-07-28 | Disposition: A | Discharge status: Home / Self Care | Payer: No Typology Code available for payment source | Attending: Emergency Medicine | Admitting: Emergency Medicine

## 2020-07-28 ENCOUNTER — Encounter (HOSPITAL_COMMUNITY): Payer: Self-pay | Admitting: *Deleted

## 2020-07-28 DIAGNOSIS — Z91040 Latex allergy status: Secondary | ICD-10-CM | POA: Diagnosis not present

## 2020-07-28 DIAGNOSIS — K8020 Calculus of gallbladder without cholecystitis without obstruction: Secondary | ICD-10-CM | POA: Insufficient documentation

## 2020-07-28 DIAGNOSIS — Z8616 Personal history of COVID-19: Secondary | ICD-10-CM | POA: Diagnosis not present

## 2020-07-28 DIAGNOSIS — R1010 Upper abdominal pain, unspecified: Secondary | ICD-10-CM

## 2020-07-28 DIAGNOSIS — R1011 Right upper quadrant pain: Secondary | ICD-10-CM | POA: Diagnosis present

## 2020-07-28 LAB — CBC WITH DIFFERENTIAL/PLATELET
Abs Immature Granulocytes: 0.06 10*3/uL (ref 0.00–0.07)
Basophils Absolute: 0 10*3/uL (ref 0.0–0.1)
Basophils Relative: 0 %
Eosinophils Absolute: 0.1 10*3/uL (ref 0.0–0.5)
Eosinophils Relative: 0 %
HCT: 39.4 % (ref 36.0–46.0)
Hemoglobin: 12.4 g/dL (ref 12.0–15.0)
Immature Granulocytes: 0 %
Lymphocytes Relative: 12 %
Lymphs Abs: 1.8 10*3/uL (ref 0.7–4.0)
MCH: 28.5 pg (ref 26.0–34.0)
MCHC: 31.5 g/dL (ref 30.0–36.0)
MCV: 90.6 fL (ref 80.0–100.0)
Monocytes Absolute: 0.7 10*3/uL (ref 0.1–1.0)
Monocytes Relative: 5 %
Neutro Abs: 11.7 10*3/uL — ABNORMAL HIGH (ref 1.7–7.7)
Neutrophils Relative %: 83 %
Platelets: 230 10*3/uL (ref 150–400)
RBC: 4.35 MIL/uL (ref 3.87–5.11)
RDW: 13.2 % (ref 11.5–15.5)
WBC: 14.4 10*3/uL — ABNORMAL HIGH (ref 4.0–10.5)
nRBC: 0 % (ref 0.0–0.2)

## 2020-07-28 LAB — COMPREHENSIVE METABOLIC PANEL
ALT: 107 U/L — ABNORMAL HIGH (ref 0–44)
AST: 140 U/L — ABNORMAL HIGH (ref 15–41)
Albumin: 4.2 g/dL (ref 3.5–5.0)
Alkaline Phosphatase: 85 U/L (ref 38–126)
Anion gap: 10 (ref 5–15)
BUN: 12 mg/dL (ref 6–20)
CO2: 21 mmol/L — ABNORMAL LOW (ref 22–32)
Calcium: 9.3 mg/dL (ref 8.9–10.3)
Chloride: 106 mmol/L (ref 98–111)
Creatinine, Ser: 0.54 mg/dL (ref 0.44–1.00)
GFR, Estimated: >60 mL/min (ref >60)
Glucose, Bld: 85 mg/dL (ref 70–99)
Potassium: 3.4 mmol/L — ABNORMAL LOW (ref 3.5–5.1)
Sodium: 137 mmol/L (ref 135–145)
Total Bilirubin: 1.2 mg/dL (ref 0.3–1.2)
Total Protein: 8 g/dL (ref 6.5–8.1)

## 2020-07-28 LAB — URINALYSIS, ROUTINE W REFLEX MICROSCOPIC
Bilirubin Urine: NEGATIVE
Glucose, UA: NEGATIVE mg/dL
Hgb urine dipstick: NEGATIVE
Ketones, ur: NEGATIVE mg/dL
Leukocytes,Ua: NEGATIVE
Nitrite: NEGATIVE
Protein, ur: NEGATIVE mg/dL
Specific Gravity, Urine: 1.017 (ref 1.005–1.030)
pH: 6 (ref 5.0–8.0)

## 2020-07-28 LAB — PREGNANCY, URINE: Preg Test, Ur: NEGATIVE

## 2020-07-28 LAB — LIPASE, BLOOD: Lipase: 32 U/L (ref 11–51)

## 2020-07-28 MED ORDER — ONDANSETRON HCL 4 MG/2ML IJ SOLN
4.0000 mg | Freq: Once | INTRAMUSCULAR | Status: AC
  Administered 2020-07-28: 4 mg via INTRAVENOUS
  Filled 2020-07-28: qty 2

## 2020-07-28 MED ORDER — ONDANSETRON HCL 4 MG PO TABS: 4.0000 mg | ORAL_TABLET | Freq: Four times a day (QID) | ORAL | 0 refills | Status: DC

## 2020-07-28 MED ORDER — OXYCODONE-ACETAMINOPHEN 5-325 MG PO TABS: 1.0000 | ORAL_TABLET | ORAL | 0 refills | Status: DC | PRN

## 2020-07-28 MED ORDER — FENTANYL CITRATE (PF) 100 MCG/2ML IJ SOLN
25.0000 ug | Freq: Once | INTRAMUSCULAR | Status: AC
  Administered 2020-07-28: 25 ug via INTRAVENOUS
  Filled 2020-07-28: qty 2

## 2020-07-28 MED ORDER — SODIUM CHLORIDE 0.9 % IV BOLUS
1000.0000 mL | Freq: Once | INTRAVENOUS | Status: AC
  Administered 2020-07-28: 1000 mL via INTRAVENOUS

## 2020-07-28 NOTE — ED Triage Notes (Signed)
Abdominal pain radiating into back, history of gallstones and has appointment with surgeon tomorrow

## 2020-07-28 NOTE — ED Notes (Signed)
US at bedside

## 2020-07-28 NOTE — Discharge Instructions (Signed)
As discussed, your ultrasound today shows that you have gallstones.  You will likely need to have your gallbladder removed soon.  Be sure to follow-up with your surgeon in 1 to 2 days for recheck.  Bland diet, avoid alcohol, spicy or greasy food.  Return to the emergency department for any new or worsening symptoms.

## 2020-07-28 NOTE — ED Provider Notes (Signed)
[facility] EMERGENCY DEPARTMENT Provider Note   CSN: [id number] Arrival date & time: 07/28/20  1158     History Chief Complaint  Patient presents with  . Abdominal Pain    Jordan Wallace is a 25 y.o. female.  HPI      Jordan Wallace is a 25 y.o. female who presents to the Emergency Department complaining of abdominal pain that radiates into her back.  Symptoms began yesterday somewhat improved then returned again this morning after eating.  Describes pain as severe, sharp through the mid to right upper abdomen and radiating into her back.  Pain worsens with food intake. Shortness of breath when pain is severe.  No chest pain.   States that she has a history of gallstones that were found after her pregnancy 3 to 4 months ago, has an appointment with her surgeon in [place] tomorrow.  Pain has been associated with nausea, no vomiting.  She denies fever, chills, diarrhea, flank pain or dysuria.    Past Medical History:  Diagnosis Date  . Anemia   . Depression    doing ok now  . Infection    UTI    Patient Active Problem List   Diagnosis Date Noted  . Encounter for postpartum care and examination after delivery 04/03/2020  . Care and examination of lactating mother 04/03/2020  . Preterm labor in third trimester with preterm delivery 04/02/2020  . Pregnancy 03/20/2020  . Back pain affecting pregnancy in third trimester 03/20/2020  . Pelvic pressure in pregnancy, antepartum, third trimester 03/20/2020  . Positive fetal fibronectin at 22 weeks to [redacted] weeks gestation 03/20/2020  . UTI (urinary tract infection) in pregnancy in third trimester 03/20/2020  . Nausea/vomiting in pregnancy 02/11/2020  . COVID-19 affecting pregnancy in third trimester 02/11/2020  . Abnormal glucose tolerance affecting pregnancy, antepartum 02/03/2020  . Vaginal discharge during pregnancy 01/31/2020  . Nephrolithiasis 01/18/2020  . Anxiety and depression 01/18/2020  . Supervision of high risk  pregnancy, antepartum 01/10/2020  . History of cesarean delivery, antepartum 12/01/2014    Past Surgical History:  Procedure Laterality Date  . CESAREAN SECTION N/A 11/28/2014   Procedure: CESAREAN SECTION;  Surgeon: Myna Hidalgo, DO;  Location: [facility];  Service: Obstetrics;  Laterality: N/A;  . FASCIOTOMY     bilateral     OB History    Gravida  2   Para  2   Term  1   Preterm  1   AB      Living  2     SAB      IAB      Ectopic      Multiple  0   Live Births  2           Family History  Problem Relation Age of Onset  . Diabetes Paternal Grandmother   . Diabetes Paternal Grandfather   . Arthritis Mother   . Lupus Mother   . Alcohol abuse Neg Hx   . Asthma Neg Hx   . Birth defects Neg Hx   . Cancer Neg Hx   . COPD Neg Hx   . Depression Neg Hx   . Drug abuse Neg Hx   . Early death Neg Hx   . Hearing loss Neg Hx   . Heart disease Neg Hx   . Hyperlipidemia Neg Hx   . Hypertension Neg Hx   . Kidney disease Neg Hx   . Learning disabilities Neg Hx   . Mental illness Neg Hx   .  Mental retardation Neg Hx   . Miscarriages / Stillbirths Neg Hx   . Stroke Neg Hx   . Vision loss Neg Hx   . Varicose Veins Neg Hx     Social History   Tobacco Use  . Smoking status: Never Smoker  . Smokeless tobacco: Never Used  Substance Use Topics  . Alcohol use: No  . Drug use: No    Home Medications Prior to Admission medications   Medication Sig Start Date End Date Taking? Authorizing Provider  acetaminophen (TYLENOL) 325 MG tablet Take 2 tablets (650 mg total) by mouth every 4 (four) hours as needed (for pain scale < 4). 04/03/20   Zipporah Plants, CNM  ferrous sulfate 325 (65 FE) MG tablet Take 1 tablet (325 mg total) by mouth every other day. 04/03/20   Zipporah Plants, CNM  fluconazole (DIFLUCAN) 150 MG tablet TAKE 1 TABLET BY MOUTH EVERY THREE DAYS FOR 2 DOSES 02/04/20 02/03/21  Vena Austria, MD  ibuprofen (ADVIL) 600 MG tablet Take 1 tablet (600 mg total) by  mouth every 6 (six) hours. 04/03/20   Zipporah Plants, CNM  paragard intrauterine copper IUD IUD 1 Intra Uterine Device (1 each total) by Intrauterine route once for 1 dose. 05/22/20 05/22/20  Conard Novak, MD  Prenatal Vit-Fe Fumarate-FA (PRENATAL MULTIVITAMIN) TABS tablet Take 1 tablet by mouth daily.     [provider]    Allergies    Latex  Review of Systems   Review of Systems  Constitutional: Negative for appetite change, chills and fever.  Respiratory: Negative for chest tightness and shortness of breath.   Cardiovascular: Negative for chest pain.  Gastrointestinal: Positive for abdominal pain and nausea. Negative for blood in stool, diarrhea and vomiting.  Genitourinary: Negative for decreased urine volume, difficulty urinating, dysuria and flank pain.  Musculoskeletal: Negative for back pain.  Skin: Negative for color change and rash.  Neurological: Negative for dizziness, weakness and numbness.  Hematological: Negative for adenopathy.    Physical Exam Updated Vital Signs BP (!) 107/49 (BP Location: Left Arm)   Pulse 61   Temp 98.4 F (36.9 C) (Oral)   Resp 18   SpO2 100%   Physical Exam Vitals and nursing note reviewed.  Constitutional:      Appearance: Normal appearance. She is well-developed. She is not ill-appearing.  HENT:     Head: Normocephalic.  Eyes:     Pupils: Pupils are equal, round, and reactive to light.  Neck:     Thyroid: No thyromegaly.     Meningeal: Kernig's sign absent.  Cardiovascular:     Rate and Rhythm: Normal rate and regular rhythm.     Heart sounds: Normal heart sounds.  Pulmonary:     Effort: Pulmonary effort is normal.     Breath sounds: Normal breath sounds. No wheezing.  Abdominal:     Palpations: Abdomen is soft.     Tenderness: There is abdominal tenderness. There is no guarding or rebound.     Comments: Patient has tenderness to palpation of the epigastric and right upper quadrant.  Abdomen is soft.  No CVA  tenderness.  No guarding or rebound.  Musculoskeletal:        General: Normal range of motion.     Cervical back: Normal range of motion and neck supple.  Skin:    General: Skin is warm.     Capillary Refill: Capillary refill takes less than 2 seconds.     Findings: No rash.  Neurological:  Mental Status: She is alert and oriented to person, place, and time.     ED Results / Procedures / Treatments   Labs (all labs ordered are listed, but only abnormal results are displayed) Labs Reviewed  COMPREHENSIVE METABOLIC PANEL - Abnormal; Notable for the following components:      Result Value   Potassium 3.4 (*)    CO2 21 (*)    AST 140 (*)    ALT 107 (*)    All other components within normal limits  CBC WITH DIFFERENTIAL/PLATELET - Abnormal; Notable for the following components:   WBC 14.4 (*)    Neutro Abs 11.7 (*)    All other components within normal limits  LIPASE, BLOOD  URINALYSIS, ROUTINE W REFLEX MICROSCOPIC  PREGNANCY, URINE    EKG None  Radiology US Abdomen Limited  Result Date: 07/28/2020 CLINICAL DATA:  epigastric pain, nausea EXAM: ULTRASOUND ABDOMEN LIMITED RIGHT UPPER QUADRANT COMPARISON:  None. FINDINGS: Gallbladder: There are multiple gallstones visualized within the gallbladder, largest measuring 1.3 cm. There is no wall thickening or pericholecystic fluid. Negative sonographic Eulah PontMurphy sign reported by the sonographer. Common bile duct: Diameter: 4.7 mm Liver: No focal lesion identified. Within normal limits in parenchymal echogenicity. Portal vein is patent on color Doppler imaging with normal direction of blood flow towards the liver. Other: None. IMPRESSION: Cholelithiasis, the largest stone measuring 1.3 cm. No evidence of acute cholecystitis. Electronically Signed   By: Caprice RenshawJacob  Kahn   On: 07/28/2020 14:31    Procedures Procedures   Medications Ordered in ED Medications  sodium chloride 0.9 % bolus 1,000 mL (has no administration in time range)   ondansetron (ZOFRAN) injection 4 mg (has no administration in time range)  fentaNYL (SUBLIMAZE) injection 25 mcg (has no administration in time range)    ED Course  I have reviewed the triage vital signs and the nursing notes.  Pertinent labs & imaging results that were available during my care of the patient were reviewed by me and considered in my medical decision making (see chart for details).    MDM Rules/Calculators/A&P                          Patient here for epigastric and right upper quadrant pain.  History of gallstones found after her pregnancy.  Gave birth in January.  Initial episode of pain yesterday after work and again this morning after eating.  No vomiting or fever.  Will obtain ultrasound of the abdomen labs and urinalysis.  Ultrasound shows cholelithiasis with largest stone measuring 1.3 cm.  No sonographic Murphy sign or evidence of acute cholecystitis.  Leukocytosis present, AST and ALT elevated.  Total bilirubin within normal limits.  No UTI.  Lipase unremarkable.   On recheck, patient's pain has improved after medication given here.  No vomiting.  Will discuss findings with general surgery.  Consulted Dr. Henreitta LeberBridges with general surgery, who is agreeable to admit patient for cholecystectomy tomorrow.  Patient initially agreeable with this plan, but after I returned to the room, she is speaking with someone on the telephone and now changed her mind and prefers to go home with pain control and will see her surgeon at [facility] tomorrow.  Return precautions discussed.    Final Clinical Impression(s) / ED Diagnoses Final diagnoses:  Pain of upper abdomen  Calculus of gallbladder without cholecystitis without obstruction    Rx / DC Orders ED Discharge Orders    None  Pauline Aus, PA-C 07/28/20 1821    Bethann Berkshire, MD 07/29/20 1020

## 2020-07-29 ENCOUNTER — Other Ambulatory Visit: Payer: Self-pay

## 2020-07-29 ENCOUNTER — Telehealth: Payer: Self-pay | Admitting: Surgery

## 2020-07-29 ENCOUNTER — Other Ambulatory Visit
Admission: RE | Admit: 2020-07-29 | Discharge: 2020-07-29 | Disposition: A | Discharge status: Home / Self Care | Payer: No Typology Code available for payment source | Source: Ambulatory Visit | Attending: Surgery | Admitting: Surgery

## 2020-07-29 DIAGNOSIS — Z01812 Encounter for preprocedural laboratory examination: Secondary | ICD-10-CM | POA: Diagnosis not present

## 2020-07-29 DIAGNOSIS — Z20822 Contact with and (suspected) exposure to covid-19: Secondary | ICD-10-CM | POA: Insufficient documentation

## 2020-07-29 LAB — SARS CORONAVIRUS 2 (TAT 6-24 HRS): SARS Coronavirus 2: NEGATIVE

## 2020-07-29 NOTE — Telephone Encounter (Signed)
This case was added on by the doctor.   Patient has been advised of Pre-Admission date/time, COVID Testing date and Surgery date.  Surgery Date: 07/30/20 Preadmission Testing Date: 07/30/20 (arrive 2 hours early) Covid Testing Date: 07/30/20.     Patient has been made aware to arrive 2 hours prior to her surgery.  She said that a nurse from the hospital has already called her with instructions.

## 2020-07-30 ENCOUNTER — Encounter: Payer: Self-pay | Admitting: Surgery

## 2020-07-30 ENCOUNTER — Encounter
Admission: RE | Disposition: A | Discharge status: Home / Self Care | Payer: Self-pay | Source: Home / Self Care | Attending: Surgery

## 2020-07-30 ENCOUNTER — Ambulatory Visit: Payer: No Typology Code available for payment source | Admitting: Certified Registered Nurse Anesthetist

## 2020-07-30 ENCOUNTER — Ambulatory Visit
Admission: RE | Admit: 2020-07-30 | Discharge: 2020-07-30 | Disposition: A | Discharge status: Home / Self Care | Payer: No Typology Code available for payment source | Attending: Surgery | Admitting: Surgery

## 2020-07-30 DIAGNOSIS — K8010 Calculus of gallbladder with chronic cholecystitis without obstruction: Secondary | ICD-10-CM | POA: Diagnosis not present

## 2020-07-30 DIAGNOSIS — K8020 Calculus of gallbladder without cholecystitis without obstruction: Secondary | ICD-10-CM

## 2020-07-30 DIAGNOSIS — Z7689 Persons encountering health services in other specified circumstances: Secondary | ICD-10-CM

## 2020-07-30 DIAGNOSIS — Z91040 Latex allergy status: Secondary | ICD-10-CM | POA: Diagnosis not present

## 2020-07-30 HISTORY — DX: Persons encountering health services in other specified circumstances: Z76.89

## 2020-07-30 LAB — COMPREHENSIVE METABOLIC PANEL
ALT: 382 U/L — ABNORMAL HIGH (ref 0–44)
AST: 120 U/L — ABNORMAL HIGH (ref 15–41)
Albumin: 4.6 g/dL (ref 3.5–5.0)
Alkaline Phosphatase: 114 U/L (ref 38–126)
Anion gap: 10 (ref 5–15)
BUN: 15 mg/dL (ref 6–20)
CO2: 20 mmol/L — ABNORMAL LOW (ref 22–32)
Calcium: 9.7 mg/dL (ref 8.9–10.3)
Chloride: 102 mmol/L (ref 98–111)
Creatinine, Ser: 0.62 mg/dL (ref 0.44–1.00)
GFR, Estimated: >60 mL/min (ref >60)
Glucose, Bld: 90 mg/dL (ref 70–99)
Potassium: 4.2 mmol/L (ref 3.5–5.1)
Sodium: 132 mmol/L — ABNORMAL LOW (ref 135–145)
Total Bilirubin: 0.8 mg/dL (ref 0.3–1.2)
Total Protein: 8.5 g/dL — ABNORMAL HIGH (ref 6.5–8.1)

## 2020-07-30 LAB — POCT PREGNANCY, URINE: Preg Test, Ur: NEGATIVE

## 2020-07-30 SURGERY — CHOLECYSTECTOMY, ROBOT-ASSISTED, LAPAROSCOPIC
Anesthesia: General

## 2020-07-30 MED ORDER — PHENYLEPHRINE HCL (PRESSORS) 10 MG/ML IV SOLN
INTRAVENOUS | Status: DC | PRN
  Administered 2020-07-30 (×2): 100 ug via INTRAVENOUS

## 2020-07-30 MED ORDER — FENTANYL CITRATE (PF) 100 MCG/2ML IJ SOLN
INTRAMUSCULAR | Status: DC | PRN
  Administered 2020-07-30 (×2): 50 ug via INTRAVENOUS

## 2020-07-30 MED ORDER — METOPROLOL TARTRATE 5 MG/5ML IV SOLN
INTRAVENOUS | Status: AC
  Filled 2020-07-30: qty 5

## 2020-07-30 MED ORDER — MIDAZOLAM HCL 2 MG/2ML IJ SOLN
INTRAMUSCULAR | Status: AC
  Filled 2020-07-30: qty 2

## 2020-07-30 MED ORDER — CHLORHEXIDINE GLUCONATE CLOTH 2 % EX PADS: 6.0000 | MEDICATED_PAD | Freq: Once | CUTANEOUS | Status: DC

## 2020-07-30 MED ORDER — DEXAMETHASONE SODIUM PHOSPHATE 10 MG/ML IJ SOLN
INTRAMUSCULAR | Status: DC | PRN
  Administered 2020-07-30: 6 mg via INTRAVENOUS

## 2020-07-30 MED ORDER — ONDANSETRON HCL 4 MG/2ML IJ SOLN
INTRAMUSCULAR | Status: AC
  Filled 2020-07-30: qty 2

## 2020-07-30 MED ORDER — SUGAMMADEX SODIUM 200 MG/2ML IV SOLN
INTRAVENOUS | Status: DC | PRN
  Administered 2020-07-30: 200 mg via INTRAVENOUS

## 2020-07-30 MED ORDER — ONDANSETRON HCL 4 MG/2ML IJ SOLN
INTRAMUSCULAR | Status: DC | PRN
  Administered 2020-07-30: 4 mg via INTRAVENOUS

## 2020-07-30 MED ORDER — OXYCODONE HCL 5 MG PO TABS
ORAL_TABLET | ORAL | Status: AC
  Administered 2020-07-30: 5 mg via ORAL
  Filled 2020-07-30: qty 1

## 2020-07-30 MED ORDER — LACTATED RINGERS IV SOLN: INTRAVENOUS | Status: DC

## 2020-07-30 MED ORDER — GABAPENTIN 300 MG PO CAPS
ORAL_CAPSULE | ORAL | Status: AC
  Administered 2020-07-30: 300 mg via ORAL
  Filled 2020-07-30: qty 1

## 2020-07-30 MED ORDER — GLYCOPYRROLATE 0.2 MG/ML IJ SOLN
INTRAMUSCULAR | Status: AC
  Filled 2020-07-30: qty 1

## 2020-07-30 MED ORDER — CEFAZOLIN SODIUM-DEXTROSE 2-4 GM/100ML-% IV SOLN
2.0000 g | INTRAVENOUS | Status: AC
  Administered 2020-07-30: 2 g via INTRAVENOUS

## 2020-07-30 MED ORDER — INDOCYANINE GREEN 25 MG IV SOLR
2.5000 mg | INTRAVENOUS | Status: AC
  Administered 2020-07-30: 2.5 mg via INTRAVENOUS
  Filled 2020-07-30: qty 10
  Filled 2020-07-30: qty 1

## 2020-07-30 MED ORDER — OXYCODONE HCL 5 MG PO TABS: 5.0000 mg | ORAL_TABLET | ORAL | 0 refills | Status: DC | PRN

## 2020-07-30 MED ORDER — FENTANYL CITRATE (PF) 100 MCG/2ML IJ SOLN
25.0000 ug | INTRAMUSCULAR | Status: DC | PRN
  Administered 2020-07-30: 25 ug via INTRAVENOUS

## 2020-07-30 MED ORDER — METOPROLOL TARTRATE 5 MG/5ML IV SOLN
INTRAVENOUS | Status: DC | PRN
  Administered 2020-07-30: 3 mg via INTRAVENOUS

## 2020-07-30 MED ORDER — ACETAMINOPHEN 500 MG PO TABS
ORAL_TABLET | ORAL | Status: AC
  Administered 2020-07-30: 1000 mg via ORAL
  Filled 2020-07-30: qty 2

## 2020-07-30 MED ORDER — BUPIVACAINE-EPINEPHRINE (PF) 0.25% -1:200000 IJ SOLN
INTRAMUSCULAR | Status: AC
  Filled 2020-07-30: qty 30

## 2020-07-30 MED ORDER — CHLORHEXIDINE GLUCONATE 0.12 % MT SOLN: 15.0000 mL | Freq: Once | OROMUCOSAL | Status: AC

## 2020-07-30 MED ORDER — CEFAZOLIN SODIUM-DEXTROSE 2-4 GM/100ML-% IV SOLN
INTRAVENOUS | Status: AC
  Filled 2020-07-30: qty 100

## 2020-07-30 MED ORDER — LIDOCAINE HCL (PF) 2 % IJ SOLN
INTRAMUSCULAR | Status: AC
  Filled 2020-07-30: qty 5

## 2020-07-30 MED ORDER — ORAL CARE MOUTH RINSE: 15.0000 mL | Freq: Once | OROMUCOSAL | Status: AC

## 2020-07-30 MED ORDER — CHLORHEXIDINE GLUCONATE 0.12 % MT SOLN
OROMUCOSAL | Status: AC
  Administered 2020-07-30: 15 mL via OROMUCOSAL
  Filled 2020-07-30: qty 15

## 2020-07-30 MED ORDER — ACETAMINOPHEN 500 MG PO TABS: 1000.0000 mg | ORAL_TABLET | Freq: Four times a day (QID) | ORAL | Status: DC | PRN

## 2020-07-30 MED ORDER — ROCURONIUM BROMIDE 10 MG/ML (PF) SYRINGE
PREFILLED_SYRINGE | INTRAVENOUS | Status: AC
  Filled 2020-07-30: qty 10

## 2020-07-30 MED ORDER — FENTANYL CITRATE (PF) 100 MCG/2ML IJ SOLN
INTRAMUSCULAR | Status: AC
  Filled 2020-07-30: qty 2

## 2020-07-30 MED ORDER — DEXMEDETOMIDINE (PRECEDEX) IN NS 20 MCG/5ML (4 MCG/ML) IV SYRINGE
PREFILLED_SYRINGE | INTRAVENOUS | Status: AC
  Filled 2020-07-30: qty 5

## 2020-07-30 MED ORDER — ONDANSETRON HCL 4 MG/2ML IJ SOLN: 4.0000 mg | Freq: Once | INTRAMUSCULAR | Status: DC | PRN

## 2020-07-30 MED ORDER — DEXMEDETOMIDINE (PRECEDEX) IN NS 20 MCG/5ML (4 MCG/ML) IV SYRINGE
PREFILLED_SYRINGE | INTRAVENOUS | Status: DC | PRN
  Administered 2020-07-30: 8 ug via INTRAVENOUS

## 2020-07-30 MED ORDER — MIDAZOLAM HCL 2 MG/2ML IJ SOLN
INTRAMUSCULAR | Status: DC | PRN
  Administered 2020-07-30: 2 mg via INTRAVENOUS

## 2020-07-30 MED ORDER — PROPOFOL 10 MG/ML IV BOLUS
INTRAVENOUS | Status: DC | PRN
  Administered 2020-07-30: 150 mg via INTRAVENOUS
  Administered 2020-07-30: 50 mg via INTRAVENOUS

## 2020-07-30 MED ORDER — PROPOFOL 10 MG/ML IV BOLUS
INTRAVENOUS | Status: AC
  Filled 2020-07-30: qty 20

## 2020-07-30 MED ORDER — BUPIVACAINE-EPINEPHRINE (PF) 0.25% -1:200000 IJ SOLN
INTRAMUSCULAR | Status: DC | PRN
  Administered 2020-07-30: 30 mL

## 2020-07-30 MED ORDER — LIDOCAINE HCL (CARDIAC) PF 100 MG/5ML IV SOSY
PREFILLED_SYRINGE | INTRAVENOUS | Status: DC | PRN
  Administered 2020-07-30: 60 mg via INTRAVENOUS

## 2020-07-30 MED ORDER — SUGAMMADEX SODIUM 500 MG/5ML IV SOLN
INTRAVENOUS | Status: AC
  Filled 2020-07-30: qty 5

## 2020-07-30 MED ORDER — ACETAMINOPHEN 500 MG PO TABS: 1000.0000 mg | ORAL_TABLET | ORAL | Status: AC

## 2020-07-30 MED ORDER — DEXAMETHASONE SODIUM PHOSPHATE 10 MG/ML IJ SOLN
INTRAMUSCULAR | Status: AC
  Filled 2020-07-30: qty 1

## 2020-07-30 MED ORDER — GLYCOPYRROLATE 0.2 MG/ML IJ SOLN
INTRAMUSCULAR | Status: DC | PRN
  Administered 2020-07-30: .2 mg via INTRAVENOUS

## 2020-07-30 MED ORDER — IBUPROFEN 600 MG PO TABS
600.0000 mg | ORAL_TABLET | Freq: Three times a day (TID) | ORAL | 1 refills | Status: DC | PRN
Start: 2020-07-30 — End: 2020-08-14

## 2020-07-30 MED ORDER — ROCURONIUM BROMIDE 100 MG/10ML IV SOLN
INTRAVENOUS | Status: DC | PRN
  Administered 2020-07-30: 20 mg via INTRAVENOUS
  Administered 2020-07-30: 40 mg via INTRAVENOUS

## 2020-07-30 MED ORDER — FENTANYL CITRATE (PF) 100 MCG/2ML IJ SOLN
INTRAMUSCULAR | Status: AC
  Administered 2020-07-30: 25 ug via INTRAVENOUS
  Filled 2020-07-30: qty 2

## 2020-07-30 MED ORDER — OXYCODONE HCL 5 MG PO TABS
5.0000 mg | ORAL_TABLET | Freq: Once | ORAL | Status: AC
Start: 2020-07-30 — End: 2020-07-30

## 2020-07-30 MED ORDER — GABAPENTIN 300 MG PO CAPS: 300.0000 mg | ORAL_CAPSULE | ORAL | Status: AC

## 2020-07-30 SURGICAL SUPPLY — 57 items
ADH SKN CLS APL DERMABOND .7 (GAUZE/BANDAGES/DRESSINGS) ×1
APL PRP STRL LF DISP 70% ISPRP (MISCELLANEOUS) ×1
BAG INFUSER PRESSURE 100CC (MISCELLANEOUS) IMPLANT
BAG SPEC RTRVL LRG 6X4 10 (ENDOMECHANICALS) ×1
CANNULA REDUC XI 12-8 STAPL (CANNULA) ×2
CANNULA REDUCER 12-8 DVNC XI (CANNULA) ×1 IMPLANT
CHLORAPREP W/TINT 26 (MISCELLANEOUS) ×2 IMPLANT
CLIP VESOLOCK MED LG 6/CT (CLIP) ×2 IMPLANT
COVER WAND RF STERILE (DRAPES) ×2 IMPLANT
CUP MEDICINE 2OZ PLAST GRAD ST (MISCELLANEOUS) ×2 IMPLANT
DECANTER SPIKE VIAL GLASS SM (MISCELLANEOUS) ×2 IMPLANT
DEFOGGER SCOPE WARMER CLEARIFY (MISCELLANEOUS) ×2 IMPLANT
DERMABOND ADVANCED (GAUZE/BANDAGES/DRESSINGS) ×1
DERMABOND ADVANCED .7 DNX12 (GAUZE/BANDAGES/DRESSINGS) ×1 IMPLANT
DRAPE ARM DVNC X/XI (DISPOSABLE) ×4 IMPLANT
DRAPE COLUMN DVNC XI (DISPOSABLE) ×1 IMPLANT
DRAPE DA VINCI XI ARM (DISPOSABLE) ×4
DRAPE DA VINCI XI COLUMN (DISPOSABLE) ×2
ELECT CAUTERY BLADE TIP 2.5 (TIP) ×2
ELECT REM PT RETURN 9FT ADLT (ELECTROSURGICAL) ×2
ELECTRODE CAUTERY BLDE TIP 2.5 (TIP) ×1 IMPLANT
ELECTRODE REM PT RTRN 9FT ADLT (ELECTROSURGICAL) ×1 IMPLANT
GLOVE SURG SYN 7.0 (GLOVE) ×4 IMPLANT
GLOVE SURG SYN 7.5  E (GLOVE) ×2
GLOVE SURG SYN 7.5 E (GLOVE) ×2 IMPLANT
GOWN STRL REUS W/ TWL LRG LVL3 (GOWN DISPOSABLE) ×4 IMPLANT
GOWN STRL REUS W/TWL LRG LVL3 (GOWN DISPOSABLE) ×8
IRRIGATOR SUCT 8 DISP DVNC XI (IRRIGATION / IRRIGATOR) IMPLANT
IRRIGATOR SUCTION 8MM XI DISP (IRRIGATION / IRRIGATOR)
IV NS 1000ML (IV SOLUTION)
IV NS 1000ML BAXH (IV SOLUTION) IMPLANT
KIT PINK PAD W/HEAD ARE REST (MISCELLANEOUS) ×2
KIT PINK PAD W/HEAD ARM REST (MISCELLANEOUS) ×1 IMPLANT
LABEL OR SOLS (LABEL) ×2 IMPLANT
MANIFOLD NEPTUNE II (INSTRUMENTS) ×2 IMPLANT
NEEDLE HYPO 22GX1.5 SAFETY (NEEDLE) ×2 IMPLANT
NS IRRIG 500ML POUR BTL (IV SOLUTION) ×2 IMPLANT
OBTURATOR OPTICAL STANDARD 8MM (TROCAR) ×2
OBTURATOR OPTICAL STND 8 DVNC (TROCAR) ×1
OBTURATOR OPTICALSTD 8 DVNC (TROCAR) ×1 IMPLANT
PACK LAP CHOLECYSTECTOMY (MISCELLANEOUS) ×2 IMPLANT
PENCIL ELECTRO HAND CTR (MISCELLANEOUS) ×2 IMPLANT
POUCH SPECIMEN RETRIEVAL 10MM (ENDOMECHANICALS) ×2 IMPLANT
SEAL CANN UNIV 5-8 DVNC XI (MISCELLANEOUS) ×4 IMPLANT
SEAL XI 5MM-8MM UNIVERSAL (MISCELLANEOUS) ×8
SET TUBE SMOKE EVAC HIGH FLOW (TUBING) ×2 IMPLANT
SOLUTION ELECTROLUBE (MISCELLANEOUS) ×2 IMPLANT
SPONGE LAP 18X18 RF (DISPOSABLE) IMPLANT
SPONGE LAP 4X18 RFD (DISPOSABLE) ×2 IMPLANT
STAPLER CANNULA SEAL DVNC XI (STAPLE) ×1 IMPLANT
STAPLER CANNULA SEAL XI (STAPLE) ×1
SUT MNCRL AB 4-0 PS2 18 (SUTURE) ×2 IMPLANT
SUT VIC AB 3-0 SH 27 (SUTURE) ×2
SUT VIC AB 3-0 SH 27X BRD (SUTURE) ×1 IMPLANT
SUT VICRYL 0 AB UR-6 (SUTURE) ×4 IMPLANT
TAPE TRANSPORE STRL 2 31045 (GAUZE/BANDAGES/DRESSINGS) ×2 IMPLANT
TROCAR BALLN GELPORT 12X130M (ENDOMECHANICALS) ×2 IMPLANT

## 2020-07-30 NOTE — Anesthesia Preprocedure Evaluation (Signed)
Anesthesia Evaluation  Patient identified by MRN, date of birth, ID band Patient awake    Reviewed: Allergy & Precautions, NPO status , Patient's Chart, lab work & pertinent test results  Airway Mallampati: II  TM Distance: >3 FB Neck ROM: Full    Dental no notable dental hx. (+) Teeth Intact   Pulmonary neg pulmonary ROS,    Pulmonary exam normal breath sounds clear to auscultation       Cardiovascular negative cardio ROS Normal cardiovascular exam Rhythm:Regular Rate:Normal     Neuro/Psych PSYCHIATRIC DISORDERS Anxiety Depression negative neurological ROS     GI/Hepatic negative GI ROS, Neg liver ROS,   Endo/Other  negative endocrine ROS  Renal/GU stones  negative genitourinary   Musculoskeletal negative musculoskeletal ROS (+)   Abdominal   Peds negative pediatric ROS (+)  Hematology  (+) anemia ,   Anesthesia Other Findings   Reproductive/Obstetrics negative OB ROS                             Anesthesia Physical  Anesthesia Plan  ASA: II  Anesthesia Plan: General   Post-op Pain Management:    Induction: Intravenous  PONV Risk Score and Plan:   Airway Management Planned: Oral ETT  Additional Equipment:   Intra-op Plan:   Post-operative Plan: Extubation in OR  Informed Consent: I have reviewed the patients History and Physical, chart, labs and discussed the procedure including the risks, benefits and alternatives for the proposed anesthesia with the patient or authorized representative who has indicated his/her understanding and acceptance.       Plan Discussed with: Anesthesiologist, CRNA and Surgeon  Anesthesia Plan Comments:         Anesthesia Quick Evaluation

## 2020-07-30 NOTE — Anesthesia Procedure Notes (Signed)
Procedure Name: Intubation Date/Time: 07/30/2020 1:23 PM Performed by: Irving Burton, CRNA Pre-anesthesia Checklist: Patient identified, Emergency Drugs available, Suction available and Patient being monitored Patient Re-evaluated:Patient Re-evaluated prior to induction Oxygen Delivery Method: Circle system utilized Preoxygenation: Pre-oxygenation with 100% oxygen Induction Type: IV induction Ventilation: Mask ventilation without difficulty Laryngoscope Size: McGraph and 3 Grade View: Grade I Tube type: Oral Tube size: 7.0 mm Number of attempts: 1 Airway Equipment and Method: Stylet and Video-laryngoscopy Placement Confirmation: ETT inserted through vocal cords under direct vision,  positive ETCO2 and breath sounds checked- equal and bilateral Secured at: 21 cm Tube secured with: Tape Dental Injury: Teeth and Oropharynx as per pre-operative assessment

## 2020-07-30 NOTE — Transfer of Care (Signed)
Immediate Anesthesia Transfer of Care Note  Patient: Jordan Wallace  Procedure(s) Performed: XI ROBOTIC ASSISTED LAPAROSCOPIC CHOLECYSTECTOMY (N/A ) INDOCYANINE GREEN FLUORESCENCE IMAGING (ICG) (N/A )  Patient Location: PACU  Anesthesia Type:General  Level of Consciousness: awake, alert  and drowsy  Airway & Oxygen Therapy: Patient Spontanous Breathing and Patient connected to face mask oxygen  Post-op Assessment: Report given to RN and Post -op Vital signs reviewed and stable  Post vital signs: Reviewed and stable  Last Vitals:  Vitals Value Taken Time  BP 135/76 07/30/20 1505  Temp 36.7 C 07/30/20 1505  Pulse 63 07/30/20 1512  Resp 17 07/30/20 1512  SpO2 100 % 07/30/20 1512  Vitals shown include unvalidated device data.  Last Pain:  Vitals:   07/30/20 1505  TempSrc:   PainSc: Asleep         Complications: No complications documented.

## 2020-07-30 NOTE — Op Note (Signed)
  Procedure Date:  07/30/2020  Pre-operative Diagnosis:  Symptomatic cholelithiasis  Post-operative Diagnosis:  Symptomatic cholelithiasis  Procedure:  Robotic assisted cholecystectomy with ICG FireFly cholangiogram  Surgeon:  Howie Ill, MD  Anesthesia:  General endotracheal  Estimated Blood Loss:  5 ml  Specimens:  gallbladder  Complications:  None  Indications for Procedure:  This is a 25 y.o. female who presents with abdominal pain and workup revealing symptomatic cholelithiasis.  The benefits, complications, treatment options, and expected outcomes were discussed with the patient. The risks of bleeding, infection, recurrence of symptoms, failure to resolve symptoms, bile duct damage, bile duct leak, retained common bile duct stone, bowel injury, and need for further procedures were all discussed with the patient and she was willing to proceed.  Description of Procedure: The patient was correctly identified in the preoperative area and brought into the operating room.  The patient was placed supine with VTE prophylaxis in place.  Appropriate time-outs were performed.  Anesthesia was induced and the patient was intubated.  Appropriate antibiotics were infused.  The abdomen was prepped and draped in a sterile fashion. An infraumbilical incision was made. A cutdown technique was used to enter the abdominal cavity without injury, and a 12 mm robotic port was inserted.  Pneumoperitoneum was obtained with appropriate opening pressures.  Three 8-mm ports were placed in the mid abdomen at the level of the umbilicus under direct visualization.  The DaVinci platform was docked, camera targeted, and instruments were placed under direct visualization.  The gallbladder was identified.  The fundus was grasped and retracted cephalad.  Adhesions were lysed bluntly and with electrocautery. The infundibulum was grasped and retracted laterally, exposing the peritoneum overlying the gallbladder.  This  was incised with electrocautery and extended on either side of the gallbladder.  FireFly cholangiogram was then obtained, and we were able to clearly identify the cystic duct and common bile duct.  The cystic duct and cystic artery were carefully dissected with combination of cautery and blunt dissection.  Both were clipped twice proximally and once distally, cutting in between.  The gallbladder was taken from the gallbladder fossa in a retrograde fashion with electrocautery. The gallbladder was placed in an Endocatch bag. The liver bed was inspected and any bleeding was controlled with electrocautery. The right upper quadrant was then inspected again revealing intact clips, no bleeding, and no ductal injury.   The 8 mm ports were removed under direct visualization and the 12 mm port was removed.  The Endocatch bag was brought out via the umbilical incision. The fascial opening was closed using 0 vicryl suture.  Local anesthetic was infused in all incisions and the incisions were closed with 4-0 Monocryl.  The wounds were cleaned and sealed with DermaBond.  The patient was emerged from anesthesia and extubated and brought to the recovery room for further management.  The patient tolerated the procedure well and all counts were correct at the end of the case.   Howie Ill, MD

## 2020-07-30 NOTE — Discharge Instructions (Signed)

## 2020-07-30 NOTE — H&P (Signed)
Date of Admission:  07/30/2020  Reason for Admission:  Symptomatic cholelithiasis  History of Present Illness: Jordan Wallace is a 25 y.o. female presenting with symptomatic cholelithiasis.  She has known gallstones which were diagnosed during her last pregnancy.  She presented to the ED on 07/28/20 with abdominal pain and her workup showed cholelithiasis without cholecystitis.  Her WBC was elevated to 14.4, and AST was 140, ALT 107, and total bili was 1.2.  She improved with some pain control and was discharged home, with plans for outpatient cholecystectomy today.  Past Medical History: Past Medical History:  Diagnosis Date  . Anemia   . Depression    doing ok now  . Infection    UTI     Past Surgical History: Past Surgical History:  Procedure Laterality Date  . CESAREAN SECTION N/A 11/28/2014   Procedure: CESAREAN SECTION;  Surgeon: Myna Hidalgo, DO;  Location: [facility];  Service: Obstetrics;  Laterality: N/A;  . FASCIOTOMY     bilateral    Home Medications: Prior to Admission medications   Medication Sig Start Date End Date Taking? Authorizing Provider  acetaminophen (TYLENOL) 325 MG tablet Take 2 tablets (650 mg total) by mouth every 4 (four) hours as needed (for pain scale < 4). 04/03/20  Yes Veal, Katelyn, CNM  oxyCODONE-acetaminophen (PERCOCET/ROXICET) 5-325 MG tablet Take 1 tablet by mouth every 4 (four) hours as needed. 07/28/20  Yes Triplett, Tammy, PA-C  ferrous sulfate 325 (65 FE) MG tablet Take 1 tablet (325 mg total) by mouth every other day. Patient not taking: No sig reported 04/03/20   Zipporah Plants, CNM  fluconazole (DIFLUCAN) 150 MG tablet TAKE 1 TABLET BY MOUTH EVERY THREE DAYS FOR 2 DOSES Patient not taking: No sig reported 02/04/20 02/03/21  Vena Austria, MD  ibuprofen (ADVIL) 600 MG tablet Take 1 tablet (600 mg total) by mouth every 6 (six) hours. Patient not taking: No sig reported 04/03/20   Zipporah Plants, CNM  ondansetron (ZOFRAN) 4 MG tablet Take 1 tablet (4  mg total) by mouth every 6 (six) hours. As needed for nausea vomiting 07/28/20   Triplett, Tammy, PA-C  paragard intrauterine copper IUD IUD 1 Intra Uterine Device (1 each total) by Intrauterine route once for 1 dose. 05/22/20 05/22/20  Conard Novak, MD    Allergies: Allergies  Allergen Reactions  . Latex Hives    Social History:  reports that she has never smoked. She has never used smokeless tobacco. She reports that she does not drink alcohol and does not use drugs.   Family History: Family History  Problem Relation Age of Onset  . Diabetes Paternal Grandmother   . Diabetes Paternal Grandfather   . Arthritis Mother   . Lupus Mother   . Alcohol abuse Neg Hx   . Asthma Neg Hx   . Birth defects Neg Hx   . Cancer Neg Hx   . COPD Neg Hx   . Depression Neg Hx   . Drug abuse Neg Hx   . Early death Neg Hx   . Hearing loss Neg Hx   . Heart disease Neg Hx   . Hyperlipidemia Neg Hx   . Hypertension Neg Hx   . Kidney disease Neg Hx   . Learning disabilities Neg Hx   . Mental illness Neg Hx   . Mental retardation Neg Hx   . Miscarriages / Stillbirths Neg Hx   . Stroke Neg Hx   . Vision loss Neg Hx   . Varicose Veins  Neg Hx     Review of Systems: Review of Systems  Constitutional: Negative for chills and fever.  HENT: Negative for hearing loss.   Respiratory: Negative for shortness of breath.   Cardiovascular: Negative for chest pain.  Gastrointestinal: Positive for abdominal pain and nausea. Negative for constipation, diarrhea and vomiting.  Genitourinary: Negative for dysuria.  Musculoskeletal: Negative for myalgias.  Skin: Negative for rash.  Neurological: Negative for dizziness.  Psychiatric/Behavioral: Negative for depression.    Physical Exam BP 128/87   Pulse 91   Temp 98.1 F (36.7 C) (Temporal)   Resp 18   Ht 5\' 3"  (1.6 m)   Wt 65.8 kg   SpO2 100%   BMI 25.69 kg/m  CONSTITUTIONAL: No acute distress HEENT:  Normocephalic, atraumatic, extraocular  motion intact. NECK: Trachea is midline, and there is no jugular venous distension.  RESPIRATORY:  Lungs are clear, and breath sounds are equal bilaterally. Normal respiratory effort without pathologic use of accessory muscles. CARDIOVASCULAR: Heart is regular without murmurs, gallops, or rubs. GI: The abdomen is soft, non-distended, currently non-tender to palpation.  MUSCULOSKELETAL:  Normal muscle strength and tone in all four extremities.  No peripheral edema or cyanosis. SKIN: Skin turgor is normal. There are no pathologic skin lesions.  NEUROLOGIC:  Motor and sensation is grossly normal.  Cranial nerves are grossly intact. PSYCH:  Alert and oriented to person, place and time. Affect is normal.  Laboratory Analysis: Results for orders placed or performed during the hospital encounter of 07/30/20 (from the past 24 hour(s))  Pregnancy, urine POC     Status: None   Collection Time: 07/30/20 11:22 AM  Result Value Ref Range   Preg Test, Ur NEGATIVE NEGATIVE  Comprehensive metabolic panel     Status: Abnormal   Collection Time: 07/30/20 11:34 AM  Result Value Ref Range   Sodium 132 (L) 135 - 145 mmol/L   Potassium 4.2 3.5 - 5.1 mmol/L   Chloride 102 98 - 111 mmol/L   CO2 20 (L) 22 - 32 mmol/L   Glucose, Bld 90 70 - 99 mg/dL   BUN 15 6 - 20 mg/dL   Creatinine, Ser 09/29/20 0.44 - 1.00 mg/dL   Calcium 9.7 8.9 - 7.35 mg/dL   Total Protein 8.5 (H) 6.5 - 8.1 g/dL   Albumin 4.6 3.5 - 5.0 g/dL   AST 32.9 (H) 15 - 41 U/L   ALT 382 (H) 0 - 44 U/L   Alkaline Phosphatase 114 38 - 126 U/L   Total Bilirubin 0.8 0.3 - 1.2 mg/dL   GFR, Estimated 924 >26 mL/min   Anion gap 10 5 - 15    Imaging: No results found.  Assessment and Plan: This is a 25 y.o. female with symptomatic cholelithiasis  --Will take patient to OR today for robotic assisted cholecystectomy.  Reviewed the surgery at length with her, including risks of bleeding, infection, injury to surrounding structures, and she's willing to  proceed.  Will use ICG for better visualization of the biliary anatomy.  Discussed post-op recovery, medications, and activity restrictions.   25, MD [facility] Pg:  [phone]

## 2020-08-03 LAB — SURGICAL PATHOLOGY

## 2020-08-13 NOTE — Anesthesia Postprocedure Evaluation (Signed)
Anesthesia Post Note  Patient: Jordan Wallace  Procedure(s) Performed: XI ROBOTIC ASSISTED LAPAROSCOPIC CHOLECYSTECTOMY (N/A ) INDOCYANINE GREEN FLUORESCENCE IMAGING (ICG) (N/A )  Patient location during evaluation: PACU Anesthesia Type: General Level of consciousness: awake and alert Pain management: pain level controlled Vital Signs Assessment: post-procedure vital signs reviewed and stable Respiratory status: spontaneous breathing, nonlabored ventilation and respiratory function stable Cardiovascular status: blood pressure returned to baseline and stable Postop Assessment: no apparent nausea or vomiting Anesthetic complications: no   No complications documented.   Last Vitals:  Vitals:   07/30/20 1545 07/30/20 1559  BP: 120/84 (!) 149/80  Pulse: 91 (!) 58  Resp: 19 16  Temp:  36.8 C  SpO2: 100% 100%    Last Pain:  Vitals:   07/31/20 1042  TempSrc:   PainSc: 4                  Loxley Cibrian Garry Heater

## 2020-08-14 ENCOUNTER — Ambulatory Visit (INDEPENDENT_AMBULATORY_CARE_PROVIDER_SITE_OTHER): Payer: No Typology Code available for payment source | Admitting: Surgery

## 2020-08-14 ENCOUNTER — Other Ambulatory Visit: Payer: Self-pay

## 2020-08-14 ENCOUNTER — Encounter: Payer: Self-pay | Admitting: Surgery

## 2020-08-14 VITALS — BP 109/70 | HR 79 | Temp 98.4°F | Ht 63.0 in | Wt 142.4 lb

## 2020-08-14 DIAGNOSIS — Z09 Encounter for follow-up examination after completed treatment for conditions other than malignant neoplasm: Secondary | ICD-10-CM

## 2020-08-14 DIAGNOSIS — K8020 Calculus of gallbladder without cholecystitis without obstruction: Secondary | ICD-10-CM

## 2020-08-14 NOTE — Patient Instructions (Signed)
If you have any concerns or questions, please feel free to call our office.  GENERAL POST-OPERATIVE PATIENT INSTRUCTIONS   WOUND CARE INSTRUCTIONS:  Keep a dry clean dressing on the wound if there is drainage. The initial bandage may be removed after 24 hours.  Once the wound has quit draining you may leave it open to air.  If clothing rubs against the wound or causes irritation and the wound is not draining you may cover it with a dry dressing during the daytime.  Try to keep the wound dry and avoid ointments on the wound unless directed to do so.  If the wound becomes bright red and painful or starts to drain infected material that is not clear, please contact your physician immediately.  If the wound is mildly pink and has a thick firm ridge underneath it, this is normal, and is referred to as a healing ridge.  This will resolve over the next 4-6 weeks.  BATHING: You may shower if you have been informed of this by your surgeon. However, Please do not submerge in a tub, hot tub, or pool until incisions are completely sealed or have been told by your surgeon that you may do so.  DIET:  You may eat any foods that you can tolerate.  It is a good idea to eat a high fiber diet and take in plenty of fluids to prevent constipation.  If you do become constipated you may want to take a mild laxative or take ducolax tablets on a daily basis until your bowel habits are regular.  Constipation can be very uncomfortable, along with straining, after recent surgery.  ACTIVITY:  You are encouraged to cough and deep breath or use your incentive spirometer if you were given one, every 15-30 minutes when awake.  This will help prevent respiratory complications and low grade fevers post-operatively if you had a general anesthetic.  You may want to hug a pillow when coughing and sneezing to add additional support to the surgical area, if you had abdominal or chest surgery, which will decrease pain during these times.  You  are encouraged to walk and engage in light activity for the next two weeks.  You should not lift more than 20 pounds for 4 weeks as it could put you at increased risk for complications.  Twenty pounds is roughly equivalent to a plastic bag of groceries. At that time- Listen to your body when lifting, if you have pain when lifting, stop and then try again in a few days. Soreness after doing exercises or activities of daily living is normal as you get back in to your normal routine.  MEDICATIONS:  Try to take narcotic medications and anti-inflammatory medications, such as tylenol, ibuprofen, naprosyn, etc., with food.  This will minimize stomach upset from the medication.  Should you develop nausea and vomiting from the pain medication, or develop a rash, please discontinue the medication and contact your physician.  You should not drive, make important decisions, or operate machinery when taking narcotic pain medication.  SUNBLOCK Use sun block to incision area over the next year if this area will be exposed to sun. This helps decrease scarring and will allow you avoid a permanent darkened area over your incision.     Gallbladder Eating Plan If you have a gallbladder condition, you may have trouble digesting fats. Eating a low-fat diet can help reduce your symptoms, and may be helpful before and after having surgery to remove your gallbladder (cholecystectomy). Your   health care provider may recommend that you work with a diet and nutrition specialist (dietitian) to help you reduce the amount of fat in your diet. What are tips for following this plan? General guidelines  Limit your fat intake to less than 30% of your total daily calories. If you eat around 1,800 calories each day, this is less than 60 grams (g) of fat per day.  Fat is an important part of a healthy diet. Eating a low-fat diet can make it hard to maintain a healthy body weight. Ask your dietitian how much fat, calories, and other  nutrients you need each day.  Eat small, frequent meals throughout the day instead of three large meals.  Drink at least 8-10 cups of fluid a day. Drink enough fluid to keep your urine clear or pale yellow.  Limit alcohol intake to no more than 1 drink a day for nonpregnant women and 2 drinks a day for men. One drink equals 12 oz of beer, 5 oz of wine, or 1 oz of hard liquor. Reading food labels  Check Nutrition Facts on food labels for the amount of fat per serving. Choose foods with less than 3 grams of fat per serving.   Shopping  Choose nonfat and low-fat healthy foods. Look for the words "nonfat," "low fat," or "fat free."  Avoid buying processed or prepackaged foods. Cooking  Cook using low-fat methods, such as baking, broiling, grilling, or boiling.  Cook with small amounts of healthy fats, such as olive oil, grapeseed oil, canola oil, or sunflower oil. What foods are recommended?  All fresh, frozen, or canned fruits and vegetables.  Whole grains.  Low-fat or non-fat (skim) milk and yogurt.  Lean meat, skinless poultry, fish, eggs, and beans.  Low-fat protein supplement powders or drinks.  Spices and herbs. What foods are not recommended?  High-fat foods. These include baked goods, fast food, fatty cuts of meat, ice cream, french toast, sweet rolls, pizza, cheese bread, foods covered with butter, creamy sauces, or cheese.  Fried foods. These include french fries, tempura, battered fish, breaded chicken, fried breads, and sweets.  Foods with strong odors.  Foods that cause bloating and gas. Summary  A low-fat diet can be helpful if you have a gallbladder condition, or before and after gallbladder surgery.  Limit your fat intake to less than 30% of your total daily calories. This is about 60 g of fat if you eat 1,800 calories each day.  Eat small, frequent meals throughout the day instead of three large meals. This information is not intended to replace advice  given to you by your health care provider. Make sure you discuss any questions you have with your health care provider. Document Revised: 10/31/2019 Document Reviewed: 10/31/2019 Elsevier Patient Education  2021 Elsevier Inc.  

## 2020-08-14 NOTE — Progress Notes (Signed)
08/14/2020  HPI: Jordan Wallace is a 25 y.o. female s/p robotic assisted cholecystectomy on 07/30/2020.  Patient presents today for follow-up.  She reports that sometimes she feels a little fuller and did have couple episodes of nausea initially.  Currently, her nausea has resolved and she is improving overall.  Denies any significant pain or tenderness at the incisions.  Vital signs: BP 109/70   Pulse 79   Temp 98.4 F (36.9 C) (Oral)   Ht 5\' 3"  (1.6 m)   Wt 142 lb 6.4 oz (64.6 kg)   LMP 07/31/2020   SpO2 98%   BMI 25.23 kg/m    Physical Exam: Constitutional: No acute distress Abdomen: Soft, nondistended, nontender to palpation.  Incisions are healing well, they are clean, dry, intact.  Dermabond is peeling off.  Assessment/Plan: This is a 25 y.o. female s/p robotic assisted cholecystectomy.  - Discussed with the patient some people after gallbladder surgery do have a period of adjustment were there is some early feeling of fullness, loose stools, and sometimes nausea, but that this resolves on its own as the body adjusts to not having a gallbladder anymore.  She seems to already be progressing in that direction. -Reminded the patient of no heavy lifting or pushing of no more than 10 to 15 pounds for a total period of 4 weeks.  She may return to work with full duties after that. - Follow-up as needed.   25, MD [facility]

## 2020-08-18 ENCOUNTER — Ambulatory Visit (INDEPENDENT_AMBULATORY_CARE_PROVIDER_SITE_OTHER): Payer: No Typology Code available for payment source | Admitting: Obstetrics and Gynecology

## 2020-08-18 ENCOUNTER — Other Ambulatory Visit: Payer: Self-pay

## 2020-08-18 ENCOUNTER — Other Ambulatory Visit (HOSPITAL_COMMUNITY)
Admission: RE | Admit: 2020-08-18 | Discharge: 2020-08-18 | Disposition: A | Discharge status: Home / Self Care | Payer: No Typology Code available for payment source | Source: Ambulatory Visit | Attending: Obstetrics and Gynecology | Admitting: Obstetrics and Gynecology

## 2020-08-18 ENCOUNTER — Encounter: Payer: Self-pay | Admitting: Obstetrics and Gynecology

## 2020-08-18 VITALS — BP 102/64 | HR 64 | Ht 63.0 in | Wt 142.0 lb

## 2020-08-18 DIAGNOSIS — N898 Other specified noninflammatory disorders of vagina: Secondary | ICD-10-CM | POA: Diagnosis not present

## 2020-08-18 DIAGNOSIS — B373 Candidiasis of vulva and vagina: Secondary | ICD-10-CM

## 2020-08-18 DIAGNOSIS — B3731 Acute candidiasis of vulva and vagina: Secondary | ICD-10-CM

## 2020-08-18 DIAGNOSIS — R8290 Unspecified abnormal findings in urine: Secondary | ICD-10-CM

## 2020-08-18 LAB — POCT URINALYSIS DIPSTICK
Appearance: NORMAL
Bilirubin, UA: NEGATIVE
Blood, UA: NEGATIVE
Glucose, UA: NEGATIVE
Ketones, UA: NEGATIVE
Leukocytes, UA: NEGATIVE
Nitrite, UA: NEGATIVE
Odor: NORMAL
Protein, UA: NEGATIVE
Spec Grav, UA: 1.02 (ref 1.010–1.025)
Urobilinogen, UA: 0.2 E.U./dL
pH, UA: 6 (ref 5.0–8.0)

## 2020-08-18 LAB — POCT WET PREP (WET MOUNT)
Clue Cells Wet Prep Whiff POC: NEGATIVE
Trichomonas Wet Prep HPF POC: ABSENT

## 2020-08-18 MED ORDER — FLUCONAZOLE 150 MG PO TABS
150.0000 mg | ORAL_TABLET | Freq: Once | ORAL | 0 refills | Status: AC
  Filled 2020-08-18: qty 2, 7d supply, fill #0

## 2020-08-18 NOTE — Progress Notes (Signed)
Patient ID: Jordan Wallace, female   DOB: 12-29-1995, 25 y.o.   MRN: [id number]  Reason for Visit: Urinary Tract Infection (Cloudy urine x 1 wk. Denies burning/frequency/urgency) and Vaginal Discharge (Yellow d/c x 2 wks uncertain of odor d/t covid loss of smell)   Subjective:     HPI:  Jordan Wallace is a 25 y.o. female who presents with concern for cloudy urine and vaginal discharge. She reports no other urinary symptoms and has noted the cloudy urine for about a week. She reports the vaginal discharge has been present for several weeks. She reports a yellow-ish discharge. She has not noted any odor associated with the discharge. She does report intermittent symptoms of vaginal irritation/itching. She reports experiencing discomfort with IC. She had previously started treatment with Monistat OTC but has discontinued it use prior to completion. She reports a recent change in her bodywash.   Gynecological History LMP: 07/31/20 Last pap smear: 03/18/20 History of STDs: None Sexually Active: Not currently  Obstetrical History G2P1102  Past Medical History:  Diagnosis Date  . Anemia   . Depression    doing ok now  . Encounter for cholecystectomy 07/30/2020  . Infection    UTI   Family History  Problem Relation Age of Onset  . Diabetes Paternal Grandmother   . Diabetes Paternal Grandfather   . Arthritis Mother   . Lupus Mother   . Alcohol abuse Neg Hx   . Asthma Neg Hx   . Birth defects Neg Hx   . Cancer Neg Hx   . COPD Neg Hx   . Depression Neg Hx   . Drug abuse Neg Hx   . Early death Neg Hx   . Hearing loss Neg Hx   . Heart disease Neg Hx   . Hyperlipidemia Neg Hx   . Hypertension Neg Hx   . Kidney disease Neg Hx   . Learning disabilities Neg Hx   . Mental illness Neg Hx   . Mental retardation Neg Hx   . Miscarriages / Stillbirths Neg Hx   . Stroke Neg Hx   . Vision loss Neg Hx   . Varicose Veins Neg Hx    Past Surgical History:  Procedure Laterality Date   . CESAREAN SECTION N/A 11/28/2014   Procedure: CESAREAN SECTION;  Surgeon: Myna Hidalgo, DO;  Location: [facility];  Service: Obstetrics;  Laterality: N/A;  . CHOLECYSTECTOMY    . FASCIOTOMY     bilateral    Short Social History:  Social History   Tobacco Use  . Smoking status: Never Smoker  . Smokeless tobacco: Never Used  Substance Use Topics  . Alcohol use: No    Allergies  Allergen Reactions  . Latex Hives    Current Outpatient Medications  Medication Sig Dispense Refill  . fluconazole (DIFLUCAN) 150 MG tablet Take 1 tablet (150 mg total) by mouth once for 1 dose. May repeat in 7 days if symptoms persist 2 tablet 0  . paragard intrauterine copper IUD IUD 1 Intra Uterine Device (1 each total) by Intrauterine route once for 1 dose. 1 Intra Uterine Device 0   No current facility-administered medications for this visit.    Review of Systems  Constitutional: Negative for chills, fatigue and fever.  HENT: HENT negative.  Eyes: Eyes negative.  Respiratory: Respiratory negative.  Cardiovascular: Cardiovascular negative.  GI: Gastrointestinal negative.  GU:       Cloudy urine Vaginal discharge Pain with IC Musculoskeletal: Musculoskeletal negative.  Skin: Skin negative.  Neurological: Neurological negative. Hematologic: Hematologic/lymphatic negative.  Psychiatric: Positive for depressed mood.        Objective:  Objective   Vitals:   08/18/20 0930  BP: 102/64  Pulse: 64  Weight: 142 lb (64.4 kg)  Height: 5\' 3"  (1.6 m)   Body mass index is 25.15 kg/m.  Physical Exam Constitutional:      Appearance: Normal appearance.  HENT:     Head: Normocephalic.  Eyes:     Pupils: Pupils are equal, round, and reactive to light.  Pulmonary:     Effort: Pulmonary effort is normal.  Abdominal:     General: Abdomen is flat.     Palpations: Abdomen is soft.     Tenderness: There is no abdominal tenderness.  Genitourinary:    Comments: External: Normal appearing vulva.  No lesions noted.  Speculum examination: Normal appearing cervix. No blood in the vaginal vault. Moderate amount of thick yellow to white discharge. IUD strings present.   Neurological:     Mental Status: She is alert.    Microscopic wet-mount exam shows KOH done, hyphae, pH 4.5.   Assessment/Plan:     24 yo, G2P1102, with vaginal discharge, wet mount + yeast, STI screening offered and accepted (bloodwork declined). Will treat for vaginal candidiasis.    Visit Diagnoses    Vaginal discharge    -  Primary   Relevant Orders   Cervicovaginal ancillary only   POCT Wet Prep (Wet Mount) (Completed)   Cloudy urine       Relevant Orders   POCT Urinalysis Dipstick (Completed)   Vaginal candidiasis       Relevant Medications   fluconazole (DIFLUCAN) 150 MG tablet     Return if symptoms worsen or fail to improve.  25, CNM [facility], [facility] 08/18/2020 10:24 AM

## 2020-08-19 ENCOUNTER — Telehealth: Payer: Self-pay | Admitting: *Deleted

## 2020-08-19 LAB — CERVICOVAGINAL ANCILLARY ONLY
Bacterial Vaginitis (gardnerella): NEGATIVE
Candida Glabrata: NEGATIVE
Candida Vaginitis: NEGATIVE
Chlamydia: NEGATIVE
Comment: NEGATIVE
Comment: NEGATIVE
Comment: NEGATIVE
Comment: NEGATIVE
Comment: NEGATIVE
Comment: NORMAL
Neisseria Gonorrhea: NEGATIVE
Trichomonas: NEGATIVE

## 2020-08-19 NOTE — Telephone Encounter (Signed)
Faxed FMLA to [employer] at [phone] 

## 2020-10-19 ENCOUNTER — Telehealth: Payer: Self-pay

## 2020-10-19 NOTE — Telephone Encounter (Signed)
Pt needs to be scheduled for a yeast infection.

## 2020-10-19 NOTE — Telephone Encounter (Signed)
Patient is scheduled for 10/20/20 with ABC  

## 2020-10-20 ENCOUNTER — Ambulatory Visit (INDEPENDENT_AMBULATORY_CARE_PROVIDER_SITE_OTHER): Payer: No Typology Code available for payment source | Admitting: Obstetrics and Gynecology

## 2020-10-20 ENCOUNTER — Other Ambulatory Visit: Payer: Self-pay

## 2020-10-20 ENCOUNTER — Encounter: Payer: Self-pay | Admitting: Obstetrics and Gynecology

## 2020-10-20 VITALS — BP 110/70 | Ht 63.0 in | Wt 144.0 lb

## 2020-10-20 DIAGNOSIS — N898 Other specified noninflammatory disorders of vagina: Secondary | ICD-10-CM

## 2020-10-20 DIAGNOSIS — R399 Unspecified symptoms and signs involving the genitourinary system: Secondary | ICD-10-CM | POA: Diagnosis not present

## 2020-10-20 LAB — POCT WET PREP WITH KOH
Clue Cells Wet Prep HPF POC: NEGATIVE
KOH Prep POC: NEGATIVE
Trichomonas, UA: NEGATIVE
Yeast Wet Prep HPF POC: NEGATIVE

## 2020-10-20 LAB — POCT URINALYSIS DIPSTICK
Bilirubin, UA: NEGATIVE
Blood, UA: NEGATIVE
Glucose, UA: NEGATIVE
Ketones, UA: NEGATIVE
Leukocytes, UA: NEGATIVE
Nitrite, UA: NEGATIVE
Protein, UA: NEGATIVE
Spec Grav, UA: 1.025 (ref 1.010–1.025)
pH, UA: 5 (ref 5.0–8.0)

## 2020-10-20 MED ORDER — CLOTRIMAZOLE-BETAMETHASONE 1-0.05 % EX CREA
TOPICAL_CREAM | CUTANEOUS | 0 refills | Status: DC
  Filled 2020-10-20: qty 15, 7d supply, fill #0

## 2020-10-20 NOTE — Progress Notes (Signed)
[place] [place]   Chief Complaint  Patient presents with   Vaginal Discharge    Abnormal, itchiness and irritation x 3 weeks   Urinary Tract Infection    Urgency and frequency, right lower back pain x 1 week    HPI:      Ms. Jordan Wallace is a 25 y.o. [id number] whose LMP was Patient's last menstrual period was 10/06/2020 (approximate)., presents today for increased vag d/c with itching and irritation for past 3 wks, question odor (can't smell well since covid/pregnancy). Had similar sx 5/22 with neg yeast/BV culture but treated with diflucan with sx relief. Pt uses sens skin soap, no dryer sheets, but wears pantyliners daily and they stay damp. She has paragard IUD with monthly menses. Had mirena IUD prior to last pregnancy and had frequent BV and yeast vag, so pt concerned happening again with paragard.  Also with urinary urgency and frequency, sometimes with good flow, as well as RT low back pain; no hematuria. No pelvic pain, no fevers. Has increased caffeine recently and decreased water intake. Neg UA 5/22 but no C&S done.  She is sex active, no new partners; Neg STD testing 5/22  Not BF  Past Medical History:  Diagnosis Date   Anemia    Depression    doing ok now   Encounter for cholecystectomy 07/30/2020   Infection    UTI    Past Surgical History:  Procedure Laterality Date   CESAREAN SECTION N/A 11/28/2014   Procedure: CESAREAN SECTION;  Surgeon: Myna Hidalgo, DO;  Location: [facility];  Service: Obstetrics;  Laterality: N/A;   CHOLECYSTECTOMY     FASCIOTOMY     bilateral    Family History  Problem Relation Age of Onset   Arthritis Mother    Lupus Mother    Diabetes Paternal Grandmother    Diabetes Paternal Grandfather    Alcohol abuse Neg Hx    Asthma Neg Hx    Birth defects Neg Hx    Cancer Neg Hx    COPD Neg Hx    Depression Neg Hx    Drug abuse Neg Hx    Early death Neg Hx    Hearing loss Neg Hx    Heart disease Neg Hx     Hyperlipidemia Neg Hx    Hypertension Neg Hx    Kidney disease Neg Hx    Learning disabilities Neg Hx    Mental illness Neg Hx    Mental retardation Neg Hx    Miscarriages / Stillbirths Neg Hx    Stroke Neg Hx    Vision loss Neg Hx    Varicose Veins Neg Hx     Social History   Socioeconomic History   Marital status: Single    Spouse name: Kendell Bane   Number of children: Not on file   Years of education: Not on file   Highest education level: Not on file  Occupational History   Occupation: [employer]  Tobacco Use   Smoking status: Never   Smokeless tobacco: Never  Substance and Sexual Activity   Alcohol use: No   Drug use: No   Sexual activity: Not Currently    Birth control/protection: I.U.D.    Comment: Paragard  Other Topics Concern   Not on file  Social History Narrative   Not on file   Social Determinants of Health   Financial Resource Strain: Not on file  Food Insecurity: Not on  file  Transportation Needs: Not on file  Physical Activity: Not on file  Stress: Not on file  Social Connections: Not on file  Intimate Partner Violence: Not on file    Outpatient Medications Prior to Visit  Medication Sig Dispense Refill   paragard intrauterine copper IUD IUD 1 Intra Uterine Device (1 each total) by Intrauterine route once for 1 dose. 1 Intra Uterine Device 0   No facility-administered medications prior to visit.      ROS:  Review of Systems  Constitutional:  Negative for fever.  Gastrointestinal:  Negative for blood in stool, constipation, diarrhea, nausea and vomiting.  Genitourinary:  Positive for frequency, urgency and vaginal discharge. Negative for dyspareunia, dysuria, flank pain, hematuria, vaginal bleeding and vaginal pain.  Musculoskeletal:  Positive for back pain.  Skin:  Negative for rash.  BREAST: No symptoms   OBJECTIVE:   Vitals:  BP 110/70   Ht 5\' 3"  (1.6 m)   Wt 144 lb (65.3 kg)   LMP 10/06/2020  (Approximate)   Breastfeeding No   BMI 25.51 kg/m   Physical Exam Vitals reviewed.  Constitutional:      Appearance: She is well-developed. She is not ill-appearing or toxic-appearing.  Pulmonary:     Effort: Pulmonary effort is normal.  Abdominal:     Tenderness: There is no right CVA tenderness or left CVA tenderness.  Genitourinary:    General: Normal vulva.     Pubic Area: No rash.      Labia:        Right: No rash, tenderness or lesion.        Left: No rash, tenderness or lesion.      Vagina: Vaginal discharge present. No erythema or tenderness.     Cervix: Normal.     Uterus: Normal. Not enlarged and not tender.      Adnexa: Right adnexa normal and left adnexa normal.       Right: No mass or tenderness.         Left: No mass or tenderness.       Comments: NEG EXT VAGINAL EXAM; STRINGY WHITE VAG D/C FROM CX OS; IUD STRINGS IN CX OS; NO D/C C/W YEAST Musculoskeletal:        General: Normal range of motion.     Cervical back: Normal range of motion.  Skin:    General: Skin is warm and dry.  Neurological:     General: No focal deficit present.     Mental Status: She is alert and oriented to person, place, and time.     Cranial Nerves: No cranial nerve deficit.  Psychiatric:        Mood and Affect: Mood normal.        Behavior: Behavior normal.        Thought Content: Thought content normal.        Judgment: Judgment normal.    Results: Results for orders placed or performed in visit on 10/20/20 (from the past 24 hour(s))  POCT Urinalysis Dipstick     Status: Normal   Collection Time: 10/20/20  4:46 PM  Result Value Ref Range   Color, UA yellow    Clarity, UA clear    Glucose, UA Negative Negative   Bilirubin, UA neg    Ketones, UA neg    Spec Grav, UA 1.025 1.010 - 1.025   Blood, UA neg    pH, UA 5.0 5.0 - 8.0   Protein, UA Negative Negative   Urobilinogen, UA  Nitrite, UA neg    Leukocytes, UA Negative Negative   Appearance     Odor    POCT Wet Prep  with KOH     Status: Normal   Collection Time: 10/20/20  4:46 PM  Result Value Ref Range   Trichomonas, UA Negative    Clue Cells Wet Prep HPF POC neg    Epithelial Wet Prep HPF POC     Yeast Wet Prep HPF POC neg    Bacteria Wet Prep HPF POC     RBC Wet Prep HPF POC     WBC Wet Prep HPF POC     KOH Prep POC Negative Negative     Assessment/Plan: UTI symptoms - Plan: POCT Urinalysis Dipstick, Urine Culture; neg UA. Check C&S. If neg, d/c caffeine, increase water. Will f/u if pos.   Vaginal irritation - Plan: POCT Wet Prep with KOH, clotrimazole-betamethasone (LOTRISONE) cream; neg exam and neg wet prep. Question irritation due to damp pantyliners. Try to d/c if possible, cotton underwear only. Treat empirically with lotrisone crm. F/u prn. Will send in diflucan if sx persist.  Vaginal discharge--doesn't look like yeast. Question normal d/c increased by IUD. Reassurance.   Meds ordered this encounter  Medications   clotrimazole-betamethasone (LOTRISONE) cream    Sig: Apply externally BID prn sx up to 2 wks    Dispense:  15 g    Refill:  0    Order Specific Question:   Supervising Provider    Answer:   Nadara Mustard [[id number]]       Return if symptoms worsen or fail to improve.  Victorhugo Preis B. Alastor Kneale, PA-C 10/20/2020 4:49 PM

## 2020-10-23 ENCOUNTER — Other Ambulatory Visit: Payer: Self-pay

## 2020-10-23 LAB — URINE CULTURE

## 2020-10-23 MED ORDER — CEPHALEXIN 500 MG PO CAPS
500.0000 mg | ORAL_CAPSULE | Freq: Two times a day (BID) | ORAL | 0 refills | Status: AC
  Filled 2020-10-23: qty 14, 7d supply, fill #0

## 2020-10-23 NOTE — Addendum Note (Signed)
Addended by: Althea Grimmer B on: 10/23/2020 02:24 PM   Modules accepted: Orders

## 2020-10-26 NOTE — Telephone Encounter (Signed)
Called pt to f/u on test results, no answer, [facility]. ?

## 2020-11-15 ENCOUNTER — Encounter: Payer: Self-pay | Admitting: Intensive Care

## 2020-11-15 ENCOUNTER — Emergency Department
Admission: EM | Admit: 2020-11-15 | Discharge: 2020-11-15 | Disposition: A | Discharge status: Home / Self Care | Payer: No Typology Code available for payment source | Attending: Emergency Medicine | Admitting: Emergency Medicine

## 2020-11-15 ENCOUNTER — Other Ambulatory Visit: Payer: Self-pay

## 2020-11-15 ENCOUNTER — Emergency Department: Payer: No Typology Code available for payment source

## 2020-11-15 DIAGNOSIS — R1012 Left upper quadrant pain: Secondary | ICD-10-CM | POA: Insufficient documentation

## 2020-11-15 DIAGNOSIS — Z8616 Personal history of COVID-19: Secondary | ICD-10-CM | POA: Diagnosis not present

## 2020-11-15 DIAGNOSIS — E876 Hypokalemia: Secondary | ICD-10-CM | POA: Insufficient documentation

## 2020-11-15 DIAGNOSIS — Z91040 Latex allergy status: Secondary | ICD-10-CM | POA: Insufficient documentation

## 2020-11-15 DIAGNOSIS — R519 Headache, unspecified: Secondary | ICD-10-CM | POA: Diagnosis not present

## 2020-11-15 DIAGNOSIS — R197 Diarrhea, unspecified: Secondary | ICD-10-CM | POA: Diagnosis not present

## 2020-11-15 DIAGNOSIS — R55 Syncope and collapse: Secondary | ICD-10-CM | POA: Diagnosis not present

## 2020-11-15 DIAGNOSIS — Z20822 Contact with and (suspected) exposure to covid-19: Secondary | ICD-10-CM | POA: Insufficient documentation

## 2020-11-15 LAB — URINALYSIS, COMPLETE (UACMP) WITH MICROSCOPIC
Bilirubin Urine: NEGATIVE
Glucose, UA: NEGATIVE mg/dL
Ketones, ur: NEGATIVE mg/dL
Nitrite: NEGATIVE
Protein, ur: 30 mg/dL — AB
Specific Gravity, Urine: >1.03 — ABNORMAL HIGH (ref 1.005–1.030)
pH: 5.5 (ref 5.0–8.0)

## 2020-11-15 LAB — RESP PANEL BY RT-PCR (FLU A&B, COVID) ARPGX2
Influenza A by PCR: NEGATIVE
Influenza B by PCR: NEGATIVE
SARS Coronavirus 2 by RT PCR: NEGATIVE

## 2020-11-15 LAB — BASIC METABOLIC PANEL
Anion gap: 7 (ref 5–15)
BUN: 13 mg/dL (ref 6–20)
CO2: 25 mmol/L (ref 22–32)
Calcium: 9 mg/dL (ref 8.9–10.3)
Chloride: 106 mmol/L (ref 98–111)
Creatinine, Ser: 0.58 mg/dL (ref 0.44–1.00)
GFR, Estimated: >60 mL/min (ref >60)
Glucose, Bld: 96 mg/dL (ref 70–99)
Potassium: 3.3 mmol/L — ABNORMAL LOW (ref 3.5–5.1)
Sodium: 138 mmol/L (ref 135–145)

## 2020-11-15 LAB — HEPATIC FUNCTION PANEL
ALT: 29 U/L (ref 0–44)
AST: 28 U/L (ref 15–41)
Albumin: 4.2 g/dL (ref 3.5–5.0)
Alkaline Phosphatase: 70 U/L (ref 38–126)
Bilirubin, Direct: 0.1 mg/dL (ref 0.0–0.2)
Indirect Bilirubin: 0.9 mg/dL (ref 0.3–0.9)
Total Bilirubin: 1 mg/dL (ref 0.3–1.2)
Total Protein: 7.7 g/dL (ref 6.5–8.1)

## 2020-11-15 LAB — CBC
HCT: 42.2 % (ref 36.0–46.0)
Hemoglobin: 14.7 g/dL (ref 12.0–15.0)
MCH: 31.3 pg (ref 26.0–34.0)
MCHC: 34.8 g/dL (ref 30.0–36.0)
MCV: 89.8 fL (ref 80.0–100.0)
Platelets: 226 10*3/uL (ref 150–400)
RBC: 4.7 MIL/uL (ref 3.87–5.11)
RDW: 12.3 % (ref 11.5–15.5)
WBC: 7.2 10*3/uL (ref 4.0–10.5)
nRBC: 0 % (ref 0.0–0.2)

## 2020-11-15 LAB — MAGNESIUM: Magnesium: 1.8 mg/dL (ref 1.7–2.4)

## 2020-11-15 LAB — POC URINE PREG, ED: Preg Test, Ur: NEGATIVE

## 2020-11-15 MED ORDER — MAGNESIUM SULFATE 2 GM/50ML IV SOLN
2.0000 g | Freq: Once | INTRAVENOUS | Status: AC
  Administered 2020-11-15: 2 g via INTRAVENOUS
  Filled 2020-11-15: qty 50

## 2020-11-15 MED ORDER — PROCHLORPERAZINE EDISYLATE 10 MG/2ML IJ SOLN
10.0000 mg | Freq: Once | INTRAMUSCULAR | Status: AC
  Administered 2020-11-15: 10 mg via INTRAVENOUS
  Filled 2020-11-15: qty 2

## 2020-11-15 MED ORDER — SUCRALFATE 1 G PO TABS
1.0000 g | ORAL_TABLET | Freq: Once | ORAL | Status: AC
  Administered 2020-11-15: 1 g via ORAL
  Filled 2020-11-15: qty 1

## 2020-11-15 MED ORDER — POTASSIUM CHLORIDE CRYS ER 20 MEQ PO TBCR
40.0000 meq | EXTENDED_RELEASE_TABLET | Freq: Once | ORAL | Status: AC
  Administered 2020-11-15: 40 meq via ORAL
  Filled 2020-11-15: qty 2

## 2020-11-15 MED ORDER — ALUM & MAG HYDROXIDE-SIMETH 200-200-20 MG/5ML PO SUSP
30.0000 mL | Freq: Once | ORAL | Status: AC
  Administered 2020-11-15: 30 mL via ORAL
  Filled 2020-11-15: qty 30

## 2020-11-15 MED ORDER — LACTATED RINGERS IV BOLUS
1000.0000 mL | Freq: Once | INTRAVENOUS | Status: AC
  Administered 2020-11-15: 1000 mL via INTRAVENOUS

## 2020-11-15 MED ORDER — DIPHENHYDRAMINE HCL 50 MG/ML IJ SOLN
25.0000 mg | Freq: Once | INTRAMUSCULAR | Status: AC
  Administered 2020-11-15: 25 mg via INTRAVENOUS
  Filled 2020-11-15: qty 1

## 2020-11-15 NOTE — ED Notes (Signed)
Attempted IV x2 without success. Will request alternate RN.

## 2020-11-15 NOTE — ED Provider Notes (Signed)
[facility] Emergency Department Provider Note  ____________________________________________   Event Date/Time   First MD Initiated Contact with Patient 11/15/20 1430     (approximate)  I have reviewed the triage vital signs and the nursing notes.   HISTORY  Chief Complaint Loss of Consciousness   HPI Jordan Wallace is a 25 y.o. 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  Patient states she remembers feeling lightheaded and seeing spots before passing out.  She was seated.  She was told she was completely unconscious although did not fall or hit her head.  She states that she has had on and off headaches over the last 2 months and had a headache today.  She also states that she had some chills last night and a little bit of a headache yesterday.  She also states she has had some diarrhea on and off since her cholecystectomy.  She notes that today she also had some epigastric and left upper quadrant abdominal discomfort.  She has not had any chest pain, cough, shortness of breath, vision changes otherwise, earache, sore throat, back pain, fevers, burning with urination, rash or other recent syncopal episodes.  No new medications.  Denies illicit drug use.  No other acute concerns at this time.  She states she has not had any headaches prior to the onset 2 months ago since he was a child.  No recent trauma         Past Medical History:  Diagnosis Date   Anemia    Depression    doing ok now   Encounter for cholecystectomy 07/30/2020   Infection    UTI    Patient Active Problem List   Diagnosis Date Noted   Symptomatic cholelithiasis    Encounter for postpartum care and examination after delivery 04/03/2020   Care and examination  of lactating mother 04/03/2020   Preterm labor in third trimester with preterm delivery 04/02/2020   Pregnancy 03/20/2020   Back pain affecting pregnancy in third trimester 03/20/2020   Pelvic pressure in pregnancy, antepartum, third trimester 03/20/2020   Positive fetal fibronectin at 22 weeks to [redacted] weeks gestation 03/20/2020   UTI (urinary tract infection) in pregnancy in third trimester 03/20/2020   Nausea/vomiting in pregnancy 02/11/2020   COVID-19 affecting pregnancy in third trimester 02/11/2020   Abnormal glucose tolerance affecting pregnancy, antepartum 02/03/2020   Vaginal discharge during pregnancy 01/31/2020   Nephrolithiasis 01/18/2020   Anxiety and depression 01/18/2020   Supervision of high risk pregnancy, antepartum 01/10/2020   History of cesarean delivery, antepartum 12/01/2014    Past Surgical History:  Procedure Laterality Date   CESAREAN SECTION N/A 11/28/2014   Procedure: CESAREAN SECTION;  Surgeon: Myna Hidalgo, DO;  Location: [facility];  Service: Obstetrics;  Laterality: N/A;   CHOLECYSTECTOMY     FASCIOTOMY     bilateral    Prior to Admission medications   Medication Sig Start Date End Date Taking? Authorizing Provider  clotrimazole-betamethasone (LOTRISONE) cream Apply externally BID prn sx up to 2 wks 10/20/20   Copland, Ilona Sorrel, PA-C  paragard intrauterine copper IUD IUD 1 Intra Uterine Device (1 each total) by Intrauterine route once for 1 dose. 05/22/20 05/22/20  Conard Novak, MD    Allergies Latex  Family History  Problem Relation Age of Onset   Arthritis Mother    Lupus Mother    Diabetes Paternal Grandmother    Diabetes Paternal Grandfather    Alcohol abuse Neg Hx    Asthma Neg Hx    Birth defects Neg Hx    Cancer Neg Hx    COPD Neg Hx    Depression Neg Hx    Drug abuse Neg Hx    Early death Neg Hx    Hearing loss Neg Hx    Heart disease Neg Hx    Hyperlipidemia Neg Hx    Hypertension Neg Hx    Kidney disease Neg Hx    Learning  disabilities Neg Hx    Mental illness Neg Hx    Mental retardation Neg Hx    Miscarriages / Stillbirths Neg Hx    Stroke Neg Hx    Vision loss Neg Hx    Varicose Veins Neg Hx     Social History Social History   Tobacco Use   Smoking status: Never   Smokeless tobacco: Never  Substance Use Topics   Alcohol use: No   Drug use: No    Review of Systems  Review of Systems  Constitutional:  Negative for chills and fever.  HENT:  Negative for sore throat.   Eyes:  Negative for pain.  Respiratory:  Negative for cough and stridor.   Cardiovascular:  Negative for chest pain.  Gastrointestinal:  Positive for abdominal pain and diarrhea. Negative for vomiting.  Genitourinary:  Negative for dysuria.  Musculoskeletal:  Negative for myalgias.  Skin:  Negative for rash.  Neurological:  Positive for loss of consciousness and headaches. Negative for seizures.  Psychiatric/Behavioral:  Negative for suicidal ideas.   All other systems reviewed and are negative.    ____________________________________________   PHYSICAL EXAM:  VITAL SIGNS: ED Triage Vitals [11/15/20 1041]  Enc Vitals Group     BP (!) 155/94     Pulse Rate (!) 101     Resp 16     Temp 98.7 F (37.1 C)     Temp Source Oral     SpO2 98 %     Weight 145 lb (65.8 kg)     Height 5\' 3"  (1.6 m)     Head Circumference      Peak Flow      Pain Score 0     Pain Loc      Pain Edu?      Excl. in GC?    Vitals:   11/15/20 1041 11/15/20 1542  BP: (!) 155/94 111/80  Pulse: (!) 101 72  Resp: 16 18  Temp: 98.7 F (37.1 C) 98.3 F (36.8 C)  SpO2: 98% 100%   Physical Exam Vitals and nursing note reviewed.  Constitutional:      General: She is not in acute distress.    Appearance: She is well-developed.  HENT:     Head: Normocephalic and atraumatic.     Right Ear: External ear normal.     Left Ear: External ear normal.     Nose: Nose normal.  Eyes:     Conjunctiva/sclera: Conjunctivae normal.  Cardiovascular:      Rate and Rhythm: Normal rate and regular rhythm.     Heart sounds: No murmur heard. Pulmonary:     Effort: Pulmonary effort is normal. No respiratory distress.     Breath sounds: Normal breath sounds.  Abdominal:     Palpations: Abdomen is soft.  Tenderness: There is abdominal tenderness (LUQ). There is no right CVA tenderness or left CVA tenderness.  Musculoskeletal:     Cervical back: Neck supple.     Right lower leg: No edema.     Left lower leg: No edema.  Skin:    General: Skin is warm and dry.     Capillary Refill: Capillary refill takes less than 2 seconds.  Neurological:     Mental Status: She is alert and oriented to person, place, and time.  Psychiatric:        Mood and Affect: Mood normal.    Cranial nerves II through XII grossly intact.  No pronator drift.  No finger dysmetria.  Symmetric 5/5 strength of all extremities.  Sensation intact to light touch in all extremities.  Unremarkable unassisted gait.  ____________________________________________   LABS (all labs ordered are listed, but only abnormal results are displayed)  Labs Reviewed  BASIC METABOLIC PANEL - Abnormal; Notable for the following components:      Result Value   Potassium 3.3 (*)    All other components within normal limits  URINALYSIS, COMPLETE (UACMP) WITH MICROSCOPIC - Abnormal; Notable for the following components:   Specific Gravity, Urine >1.030 (*)    Hgb urine dipstick SMALL (*)    Protein, ur 30 (*)    Leukocytes,Ua TRACE (*)    Bacteria, UA RARE (*)    All other components within normal limits  RESP PANEL BY RT-PCR (FLU A&B, COVID) ARPGX2  CBC  MAGNESIUM  HEPATIC FUNCTION PANEL  POC URINE PREG, ED   ____________________________________________  EKG  Sinus rhythm with ventricular rate of 93, normal axis, unremarkable intervals without evidence of acute ischemia or significant arrhythmia. ____________________________________________  RADIOLOGY  ED MD interpretation:  CT head is unremarkable for evidence of mass-effect, hemorrhage, abnormal ventricles or other acute process  Official radiology report(s): CT HEAD WO CONTRAST ( )  Result Date: 11/15/2020 CLINICAL DATA:  Headache.  Syncope. EXAM: CT HEAD WITHOUT CONTRAST TECHNIQUE: Contiguous axial images were obtained from the base of the skull through the vertex without intravenous contrast. COMPARISON:  None. FINDINGS: Brain: No evidence of acute infarction, hemorrhage, hydrocephalus, extra-axial collection or mass lesion/mass effect. Vascular: No hyperdense vessel or unexpected calcification. Skull: Normal. Negative for fracture or focal lesion. Sinuses/Orbits: Normal globes and orbits.  Clear sinuses. Other: None peer IMPRESSION: Normal unenhanced CT scan of the brain. Electronically Signed   By: Amie Portland M.D.   On: 11/15/2020 16:36    ____________________________________________   PROCEDURES  Procedure(s) performed (including Critical Care):  Procedures   ____________________________________________   INITIAL IMPRESSION / ASSESSMENT AND PLAN / ED COURSE        Patient presents with above-stated history exam after syncopal episode that occurred earlier today at work described above.  She does not have any fall or trauma or injuries associated with this.  She does state that she has had on and off headaches over the last 2 months and has little bit of a headache today that symptom started last night and his typical headaches he has had over this time course.  She is also had some diarrhea over the last several weeks and thought that was related to having her gallbladder removed.  He has had some intermittent epigastric and left upper quadrant pain associate with this.  However no other recent syncopal episodes.  On arrival she is afebrile hemodynamically stable.  She has mild left upper quadrant tenderness but otherwise her abdomen is soft and nontender  throughout and she has no CVA tenderness.   Her lungs are clear and she has a nonfocal neuro exam.  Differential includes vasovagal syncope, dehydration, anemia, and arrhythmia.  CT head and exam are not consistent with CVA, intracranial hemorrhage or other immediate life-threatening process.  Suspect headache may be related to some dehydration versus migraine-like headache is stated had much improved after my assessment following below noted headache cocktail.  I suspect she may been little dehydrated from some chronic diarrhea she has been having and having some gastritis given some mild tenderness in left lower quadrant.  However she stated her belly pain much improved after some Carafate and given otherwise benign abdominal exam I have a very low suspicion for other immediate life-threatening abdominal process.  Low suspicion for PE as patient is PERC negative.  CBC shows no leukocytosis or acute anemia.  BMP remarkable for K of 3.3 but otherwise unremarkable.  COVID influenza test is negative.  Magnesium is within normal limits.  Pregnancy test is negative.  Urinalysis is trace leukocyte esterase and rare bacteria with 6-10 WBCs but 11-20 squamous epithelial cells.  This is possibly contaminated and given patient denies any urinary symptoms I have a low suspicion for clinically significant cystitis at this time.  Overall unclear precise etiology for patient's couple episodes earlier today although suspect it may have been multifactorial including component of some dehydration hypokalemia as well as possibly a vasovagal component in setting of headache and patient describing feeling dizzy and seeing spots before passing out.  She was seated and does not seem orthostatic.  However given stable vitals with otherwise reassuring exam work-up and patient stating she is now feeling much better on my reassessment I think she is stable for discharge with close outpatient follow-up.  Discharged stable condition.  Strict return precautions advised and  discussed.        ____________________________________________   FINAL CLINICAL IMPRESSION(S) / ED DIAGNOSES  Final diagnoses:  Syncope and collapse  Hypokalemia  Nonintractable headache, unspecified chronicity pattern, unspecified headache type  Diarrhea, unspecified type    Medications  magnesium sulfate IVPB 2 g 50 mL (2 g Intravenous New Bag/Given 11/15/20 1727)  potassium chloride SA (KLOR-CON) CR tablet 40 mEq (40 mEq Oral Given 11/15/20 1437)  lactated ringers bolus 1,000 mL (1,000 mLs Intravenous New Bag/Given 11/15/20 1727)  prochlorperazine (COMPAZINE) injection 10 mg (10 mg Intravenous Given 11/15/20 1724)  diphenhydrAMINE (BENADRYL) injection 25 mg (25 mg Intravenous Given 11/15/20 1724)  alum & mag hydroxide-simeth (MAALOX/MYLANTA) 200-200-20 MG/5ML suspension 30 mL (30 mLs Oral Given 11/15/20 1557)  sucralfate (CARAFATE) tablet 1 g (1 g Oral Given 11/15/20 1557)     ED Discharge Orders     None        Note:  This document was prepared using Dragon voice recognition software and may include unintentional dictation errors.    Gilles ChiquitoSmith, Clancy Mullarkey P, MD 11/15/20 [phone]

## 2020-11-15 NOTE — ED Triage Notes (Signed)
Patient was working and reports started feeling hot and seeing dots and then had episode of loc. Co-workers reported patient was "dead weight and had episode of LOC." A&O x4 at this time

## 2020-11-18 MED ORDER — FLUCONAZOLE 150 MG PO TABS: 150.0000 mg | ORAL_TABLET | Freq: Once | ORAL | 0 refills | Status: AC

## 2020-11-18 NOTE — Telephone Encounter (Addendum)
Pt left msg on triage asking for Rx to be sent to her pharmacy. She is having some vaginal discharge with a sour odor and vaginal itching. Says she was given a cream and did not work. Asking Rx to be sent to [facility] in [place] if sent today, if sent tomorrow then [facility]. Has a 67 mo old baby and 25 yo and its hard to get in here with both. Denies UTI sx.

## 2020-11-18 NOTE — Addendum Note (Signed)
Addended by: Althea Grimmer B on: 11/18/2020 04:43 PM   Modules accepted: Orders

## 2020-11-18 NOTE — Telephone Encounter (Signed)
Pt aware Rx sent.  

## 2020-11-18 NOTE — Telephone Encounter (Signed)
Diflucan eRxd to [facility]. F/u prn.

## 2020-11-23 ENCOUNTER — Other Ambulatory Visit: Payer: Self-pay

## 2020-11-23 ENCOUNTER — Telehealth: Payer: Self-pay

## 2020-11-23 MED ORDER — METRONIDAZOLE 500 MG PO TABS
500.0000 mg | ORAL_TABLET | Freq: Two times a day (BID) | ORAL | 0 refills | Status: AC
  Filled 2020-11-23: qty 14, 7d supply, fill #0

## 2020-11-23 NOTE — Telephone Encounter (Signed)
Pt aware.

## 2020-11-23 NOTE — Telephone Encounter (Signed)
Pt calling; now has fishy smelling yellowish d/c; she is very sensitive to things and is trying to figure ou why she has this.  Thinks it's BV.  Can rx be sent in?  [phone]

## 2020-11-23 NOTE — Telephone Encounter (Signed)
Rx flagyl eRxd. Pt needs to stop wearing pantyliners bc moisture can cause sx. Can also try boric acid supp nightly for 1-2 months to prevent sx/maintain vag pH. Can get at [facility]. Start after flagyl tx.

## 2021-03-12 ENCOUNTER — Ambulatory Visit (INDEPENDENT_AMBULATORY_CARE_PROVIDER_SITE_OTHER): Payer: No Typology Code available for payment source | Admitting: Neurology

## 2021-03-12 ENCOUNTER — Encounter: Payer: Self-pay | Admitting: Neurology

## 2021-03-12 VITALS — BP 103/62 | HR 77 | Ht 63.0 in | Wt 147.5 lb

## 2021-03-12 DIAGNOSIS — R55 Syncope and collapse: Secondary | ICD-10-CM

## 2021-03-12 NOTE — Progress Notes (Signed)
[facility]  PATIENT: Jordan Wallace DOB: 09/26/1995  REQUESTING CLINICIAN: Scifres, Dorothy, PA-C HISTORY FROM: Patient  REASON FOR VISIT: Syncope vs. seizure   HISTORICAL  CHIEF COMPLAINT:  Chief Complaint  Patient presents with   New Patient (Initial Visit)    Rm 12. Alone. NP/[facility]/Dorothy Scifres PA/syncopal episodes Pt reports syncopal episodes in August and in October, both at work. Last weekend she reported feeling uneasy and hot at work.    HISTORY OF PRESENT ILLNESS:  This is a 25 year old woman with no past medical history who is presenting with 2 episodes of syncope.  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  In August 25, while doing a short case, she reports started feeling lightheaded, She was seated the whole time, felt hot then started seeing black spots and the next thing that she remembers is waking up in the PACU.  She reports that day she had breakfast and was well-hydrated.  She was taken to the ED where initial labs and head CT was normal and she was cleared to go back to work. The second episode happened in October, at that time she was standing during surgery, she felt hot, again feeling lightheaded and the next thing that she remember is waking up in the PACU.  She reported eating breakfast that day. Patient reported last week if she had a presyncopal event, she did not pass out. She felt hot and lightheaded, she was able to get out of surgery and sit and wait for the episode to pass.  Denies any other events.  She denies any previous history of seizure.  Report that little brother had seizures in the past.   She reported being under a lot of stress lately, she is a single mother with 2 kids, youngest one is 68-month old, they are living at her parents house because her own house has mold.  Reported he has been a stressful event lately.      Handedness: left handed   Seizure Type: Unclear, had 2 syncopal episode.  Current frequency: 2 episodes.  Any injuries from seizures: None.    Seizure risk factors: Brother with seizure, no other seizure risk factor reported  Previous ASMs: None  Currenty ASMs: None  ASMs side effects: Not applicable  Brain Images: Normal head CT  Previous EEGs: None   OTHER MEDICAL CONDITIONS: None   REVIEW OF SYSTEMS: Full 14 system review of systems performed and negative with exception of: as noted in the HPI  ALLERGIES: Allergies  Allergen Reactions   Latex Hives    HOME MEDICATIONS: Outpatient Medications Prior to Visit  Medication Sig Dispense Refill   paragard intrauterine copper IUD IUD 1 Intra Uterine Device (1 each total) by Intrauterine route once for 1 dose. 1 Intra Uterine Device 0   clotrimazole-betamethasone (LOTRISONE) cream Apply externally BID prn sx up to 2 wks 15 g 0   No facility-administered medications prior to visit.    PAST MEDICAL HISTORY: Past Medical History:  Diagnosis Date   Anemia    Depression    doing ok now   Encounter for cholecystectomy 07/30/2020   Infection    UTI    PAST SURGICAL HISTORY: Past Surgical History:  Procedure Laterality Date   CESAREAN SECTION N/A 11/28/2014   Procedure: CESAREAN SECTION;  Surgeon: Myna Hidalgo, DO;  Location: [facility];  Service: Obstetrics;  Laterality: N/A;   CHOLECYSTECTOMY  FASCIOTOMY     bilateral    FAMILY HISTORY: Family History  Problem Relation Age of Onset   Arthritis Mother    Lupus Mother    Diabetes Paternal Grandmother    Diabetes Paternal Grandfather    Alcohol abuse Neg Hx    Asthma Neg Hx    Birth defects Neg Hx    Cancer Neg Hx    COPD Neg Hx    Depression Neg Hx    Drug abuse Neg Hx    Early death Neg Hx    Hearing loss Neg Hx    Heart disease Neg Hx    Hyperlipidemia Neg Hx    Hypertension Neg Hx    Kidney disease Neg Hx    Learning disabilities Neg Hx     Mental illness Neg Hx    Mental retardation Neg Hx    Miscarriages / Stillbirths Neg Hx    Stroke Neg Hx    Vision loss Neg Hx    Varicose Veins Neg Hx     SOCIAL HISTORY: Social History   Socioeconomic History   Marital status: Single    Spouse name: Pieter Partridge   Number of children: Not on file   Years of education: Not on file   Highest education level: Not on file  Occupational History   Occupation: Hydrographic surveyor: [employer]  Tobacco Use   Smoking status: Never   Smokeless tobacco: Never  Substance and Sexual Activity   Alcohol use: No   Drug use: No   Sexual activity: Not Currently    Birth control/protection: I.U.D.    Comment: Paragard  Other Topics Concern   Not on file  Social History Narrative   Not on file   Social Determinants of Health   Financial Resource Strain: Not on file  Food Insecurity: Not on file  Transportation Needs: Not on file  Physical Activity: Not on file  Stress: Not on file  Social Connections: Not on file  Intimate Partner Violence: Not on file    PHYSICAL EXAM  GENERAL EXAM/CONSTITUTIONAL: Vitals:  Vitals:   03/12/21 1107  BP: 103/62  Pulse: 77  Weight: 147 lb 8 oz (66.9 kg)  Height: 5\' 3"  (1.6 m)   Body mass index is 26.13 kg/m. Wt Readings from Last 3 Encounters:  03/12/21 147 lb 8 oz (66.9 kg)  11/15/20 145 lb (65.8 kg)  10/20/20 144 lb (65.3 kg)   Patient is in no distress; well developed, nourished and groomed; neck is supple  CARDIOVASCULAR: Examination of carotid arteries is normal; no carotid bruits Regular rate and rhythm, no murmurs Examination of peripheral vascular system by observation and palpation is normal  EYES: Pupils round and reactive to light, Visual fields full to confrontation, Extraocular movements intacts,  No results found.  MUSCULOSKELETAL: Gait, strength, tone, movements noted in Neurologic exam below  NEUROLOGIC: MENTAL STATUS:  No flowsheet data found. awake, alert,  oriented to person, place and time recent and remote memory intact normal attention and concentration language fluent, comprehension intact, naming intact fund of knowledge appropriate  CRANIAL NERVE:  2nd, 3rd, 4th, 6th - pupils equal and reactive to light, visual fields full to confrontation, extraocular muscles intact, no nystagmus 5th - facial sensation symmetric 7th - facial strength symmetric 8th - hearing intact 9th - palate elevates symmetrically, uvula midline 11th - shoulder shrug symmetric 12th - tongue protrusion midline  MOTOR:  normal bulk and tone, full strength in the BUE, BLE  SENSORY:  normal and symmetric to light touch, pinprick, temperature, vibration  COORDINATION:  finger-nose-finger, fine finger movements normal  REFLEXES:  deep tendon reflexes present and symmetric  GAIT/STATION:  normal     DIAGNOSTIC DATA (LABS, IMAGING, TESTING) - I reviewed patient records, labs, notes, testing and imaging myself where available.  Lab Results  Component Value Date   WBC 7.2 11/15/2020   HGB 14.7 11/15/2020   HCT 42.2 11/15/2020   MCV 89.8 11/15/2020   PLT 226 11/15/2020      Component Value Date/Time   NA 138 11/15/2020 1044   K 3.3 (L) 11/15/2020 1044   CL 106 11/15/2020 1044   CO2 25 11/15/2020 1044   GLUCOSE 96 11/15/2020 1044   BUN 13 11/15/2020 1044   CREATININE 0.58 11/15/2020 1044   CALCIUM 9.0 11/15/2020 1044   PROT 7.7 11/15/2020 1521   ALBUMIN 4.2 11/15/2020 1521   AST 28 11/15/2020 1521   ALT 29 11/15/2020 1521   ALKPHOS 70 11/15/2020 1521   BILITOT 1.0 11/15/2020 1521   GFRNONAA >60 11/15/2020 1044   GFRAA >60 09/22/2019 1734   No results found for: CHOL, HDL, LDLCALC, LDLDIRECT, TRIG No results found for: HGBA1C No results found for: VITAMINB12 No results found for: TSH   Head CT 10/2019 Normal unenhanced CT scan of the brain  I personally reviewed brain Images.   ASSESSMENT AND PLAN  25 y.o. year old female  with  with no past medical history who is presenting with 2 episodes of syncope.  Per description, this sounds like vasovagal due to prodrome of lightheadedness, feeling dizzy and seeing dark spots.  There were no reported abnormal movements or urinary incontinence.  However, I will obtain a routine EEG for further evaluation.  If EEG normal,  patient can follow-up with her primary care doctor and return if worse.    1. Syncope, unspecified syncope type     PLAN: Routine EEG  Follow up with your primary care doctor and return if worse   Per [facility] statutes, patients with seizures are not allowed to drive until they have been seizure-free for six months.  Other recommendations include using caution when using heavy equipment or power tools. Avoid working on ladders or at heights. Take showers instead of baths.  Do not swim alone.  Ensure the water temperature is not too high on the home water heater. Do not go swimming alone. Do not lock yourself in a room alone (i.e. bathroom). When caring for infants or small children, sit down when holding, feeding, or changing them to minimize risk of injury to the child in the event you have a seizure. Maintain good sleep hygiene. Avoid alcohol.  Also recommend adequate sleep, hydration, good diet and minimize stress.   During the Seizure  - First, ensure adequate ventilation and place patients on the floor on their left side  Loosen clothing around the neck and ensure the airway is patent. If the patient is clenching the teeth, do not force the mouth open with any object as this can cause severe damage - Remove all items from the surrounding that can be hazardous. The patient may be oblivious to what's happening and may not even know what he or she is doing. If the patient is confused and wandering, either gently guide him/her away and block access to outside areas - Reassure the individual and be comforting - Call 911. In most cases, the seizure ends  before EMS arrives. However, there are cases when  seizures may last over 3 to 5 minutes. Or the individual may have developed breathing difficulties or severe injuries. If a pregnant patient or a person with diabetes develops a seizure, it is prudent to call an ambulance. - Finally, if the patient does not regain full consciousness, then call EMS. Most patients will remain confused for about 45 to 90 minutes after a seizure, so you must use judgment in calling for help. - Avoid restraints but make sure the patient is in a bed with padded side rails - Place the individual in a lateral position with the neck slightly flexed; this will help the saliva drain from the mouth and prevent the tongue from falling backward - Remove all nearby furniture and other hazards from the area - Provide verbal assurance as the individual is regaining consciousness - Provide the patient with privacy if possible - Call for help and start treatment as ordered by the caregiver   After the Seizure (Postictal Stage)  After a seizure, most patients experience confusion, fatigue, muscle pain and/or a headache. Thus, one should permit the individual to sleep. For the next few days, reassurance is essential. Being calm and helping reorient the person is also of importance.  Most seizures are painless and end spontaneously. Seizures are not harmful to others but can lead to complications such as stress on the lungs, brain and the heart. Individuals with prior lung problems may develop labored breathing and respiratory distress.     Orders Placed This Encounter  Procedures   EEG adult     No orders of the defined types were placed in this encounter.   Return if symptoms worsen or fail to improve.    Alric Ran, MD 03/12/2021, 11:59 AM  [facility] [address], [place], [place] [address] [phone]

## 2021-03-12 NOTE — Patient Instructions (Signed)
Routine EEG  Follow up with your primary care doctor and return if worse

## 2021-03-18 ENCOUNTER — Ambulatory Visit (INDEPENDENT_AMBULATORY_CARE_PROVIDER_SITE_OTHER): Payer: No Typology Code available for payment source | Admitting: Neurology

## 2021-03-18 ENCOUNTER — Other Ambulatory Visit: Payer: Self-pay

## 2021-03-18 DIAGNOSIS — R55 Syncope and collapse: Secondary | ICD-10-CM | POA: Diagnosis not present

## 2021-03-24 NOTE — Procedures (Signed)
° ° °  History:  85 yof with syncope vs. seizure  EEG classification:  Awake and asleep  Description of the recording: The background rhythms of this recording consists of a fairly well modulated medium amplitude background activity of 10 Hz. As the record progresses, the patient initially is in the waking state, but appears to enter the early stage II sleep during the recording, with rudimentary sleep spindles and vertex sharp wave activity seen. During the wakeful state, photic stimulation is performed, and no abnormal responses were seen. Hyperventilation was also performed, no abnormal response seen. No epileptiform discharges seen during this recording. There was no focal slowing. EKG monitor shows no evidence of cardiac rhythm abnormalities with a heart rate of 66.  Impression: This is a normal EEG recording in the waking and sleeping state. No evidence interictal epileptiform discharges were seen at any time during the recording.  A normal EEG does not exclude a diagnosis of epilepsy.    Windell Norfolk, MD [facility]

## 2021-04-12 ENCOUNTER — Other Ambulatory Visit: Payer: Self-pay

## 2021-04-12 MED ORDER — METRONIDAZOLE 500 MG PO TABS
500.0000 mg | ORAL_TABLET | Freq: Two times a day (BID) | ORAL | 0 refills | Status: DC
  Filled 2021-04-12: qty 14, 7d supply, fill #0

## 2021-04-12 MED ORDER — METRONIDAZOLE 0.75 % VA GEL
VAGINAL | 5 refills | Status: AC
Start: 2021-04-12 — End: ?
  Filled 2021-04-12: qty 70, 15d supply, fill #0

## 2021-04-12 MED ORDER — FLUCONAZOLE 150 MG PO TABS
ORAL_TABLET | ORAL | 0 refills | Status: DC
  Filled 2021-04-12: qty 5, 15d supply, fill #0

## 2021-07-05 ENCOUNTER — Other Ambulatory Visit: Payer: Self-pay

## 2021-07-05 MED ORDER — PREDNISONE 10 MG PO TABS
ORAL_TABLET | ORAL | 0 refills | Status: DC
  Filled 2021-07-05: qty 21, 6d supply, fill #0

## 2021-07-05 MED ORDER — CEFDINIR 300 MG PO CAPS
ORAL_CAPSULE | ORAL | 0 refills | Status: DC
  Filled 2021-07-05: qty 20, 10d supply, fill #0

## 2021-07-05 MED ORDER — FLUTICASONE PROPIONATE 50 MCG/ACT NA SUSP
NASAL | 12 refills | Status: AC
Start: 2021-07-05 — End: ?
  Filled 2021-07-05: qty 16, 30d supply, fill #0

## 2021-08-04 ENCOUNTER — Other Ambulatory Visit: Payer: Self-pay

## 2021-08-05 ENCOUNTER — Other Ambulatory Visit: Payer: Self-pay

## 2021-08-05 MED ORDER — FLUCONAZOLE 150 MG PO TABS
ORAL_TABLET | ORAL | 0 refills | Status: DC
  Filled 2021-08-05: qty 2, 7d supply, fill #0

## 2021-08-05 MED ORDER — METRONIDAZOLE 500 MG PO TABS
500.0000 mg | ORAL_TABLET | Freq: Two times a day (BID) | ORAL | 0 refills | Status: DC
  Filled 2021-08-05: qty 14, 7d supply, fill #0

## 2021-10-07 ENCOUNTER — Other Ambulatory Visit: Payer: Self-pay

## 2021-10-07 MED ORDER — LEVOFLOXACIN 500 MG PO TABS
ORAL_TABLET | ORAL | 0 refills | Status: DC
  Filled 2021-10-07: qty 14, 14d supply, fill #0

## 2021-10-30 ENCOUNTER — Telehealth: Payer: No Typology Code available for payment source | Admitting: Nurse Practitioner

## 2021-10-30 DIAGNOSIS — H109 Unspecified conjunctivitis: Secondary | ICD-10-CM | POA: Diagnosis not present

## 2021-10-31 MED ORDER — POLYMYXIN B-TRIMETHOPRIM 10000-0.1 UNIT/ML-% OP SOLN: 1.0000 [drp] | OPHTHALMIC | 0 refills | Status: DC

## 2021-10-31 NOTE — Progress Notes (Signed)
I have spent 5 minutes in review of e-visit questionnaire, review and updating patient chart, medical decision making and response to patient.  ° °Gissell Barra W Eleftherios Dudenhoeffer, NP ° °  °

## 2021-10-31 NOTE — Progress Notes (Signed)

## 2021-11-15 IMAGING — CT CT HEAD W/O CM
3 series · 16 of 47 positions shown, 19 images · non-contrast
Comparison: None.

CLINICAL DATA: Headache.  Syncope.

EXAM:
CT HEAD WITHOUT CONTRAST
TECHNIQUE: Contiguous axial images were obtained from the base of the skull
through the vertex without intravenous contrast.

[Series 2: head wo · axial · 0.44mm/px · z∈[-150,-15]mm · 10 of 33 slices shown, 13 images]
[im 3/33  brain]
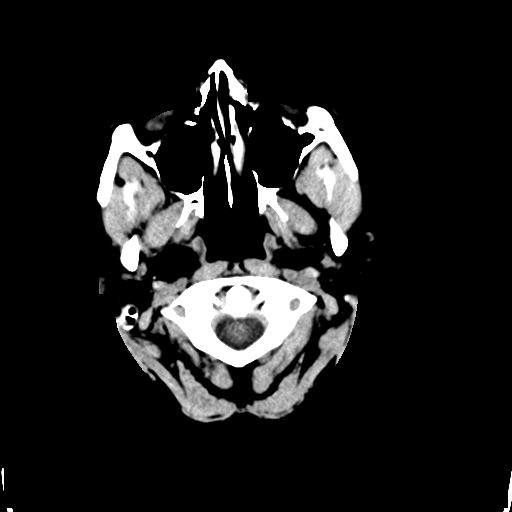
[im 3/33  bone]
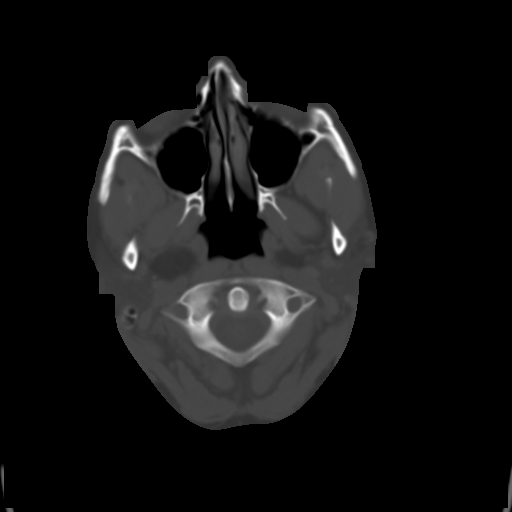
[im 6/33  brain]
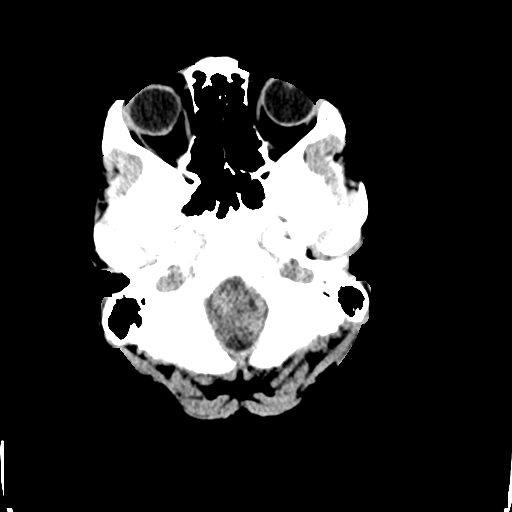
[im 9/33  brain]
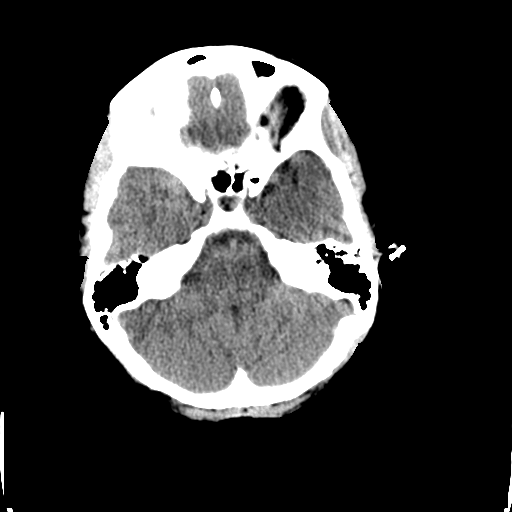
[im 12/33  brain]
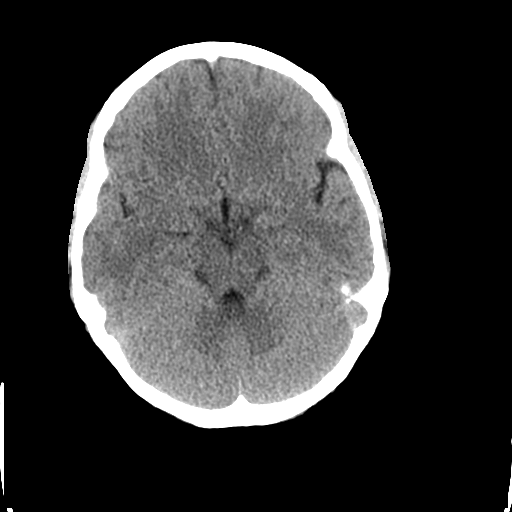
[im 15/33  brain]
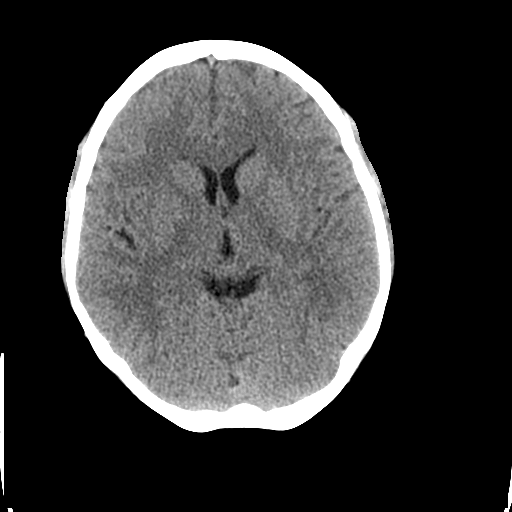
[im 15/33  bone]
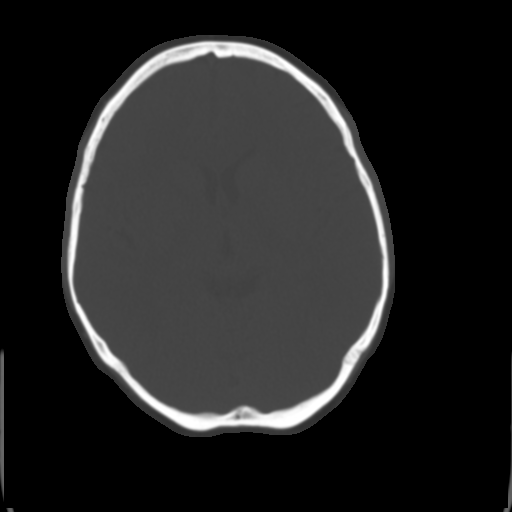
[im 18/33  brain]
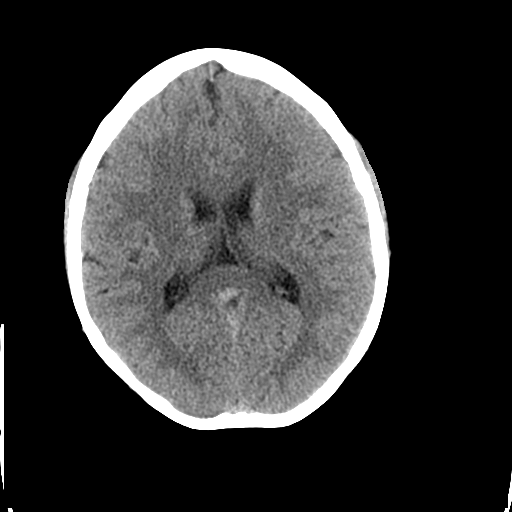
[im 21/33  brain]
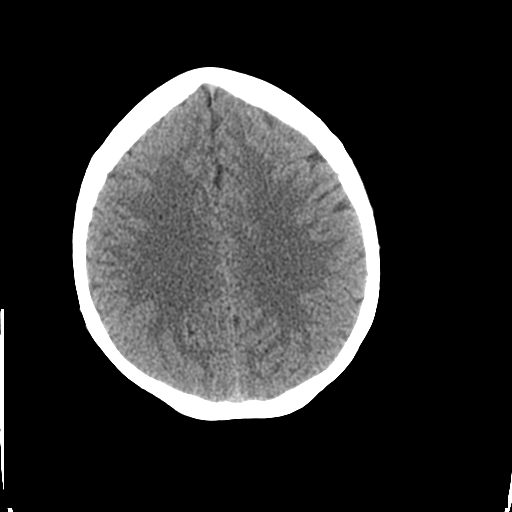
[im 25/33  brain]
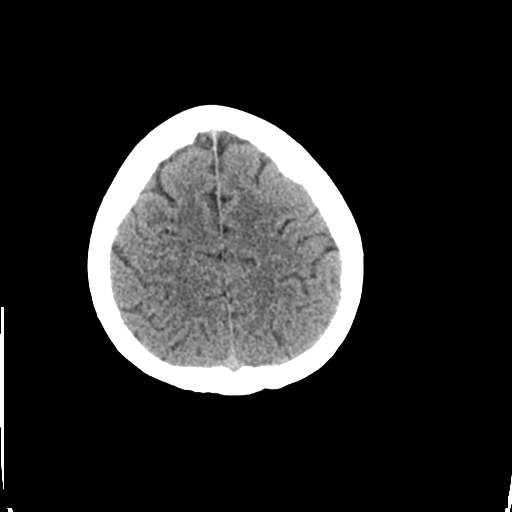
[im 27/33  brain]
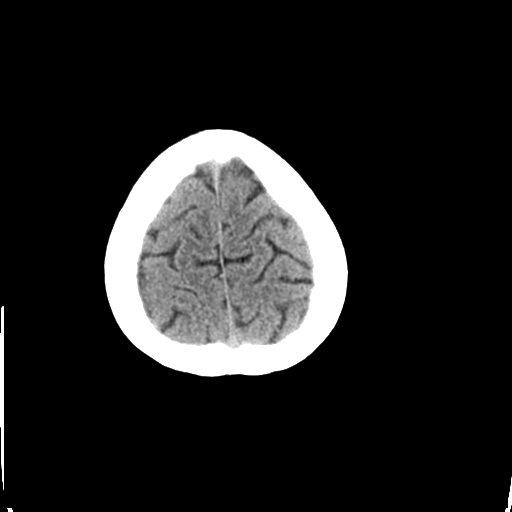
[im 27/33  bone]
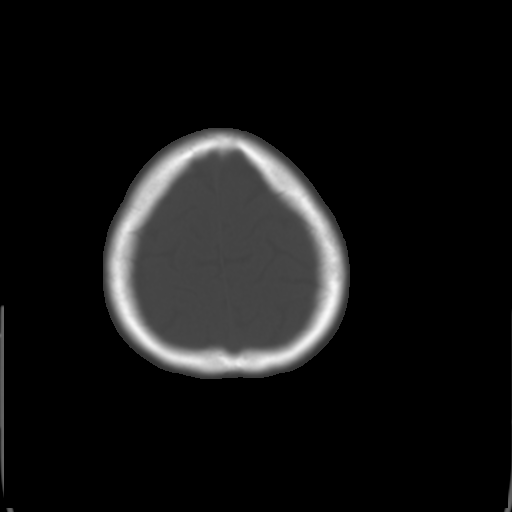
[im 30/33  brain]
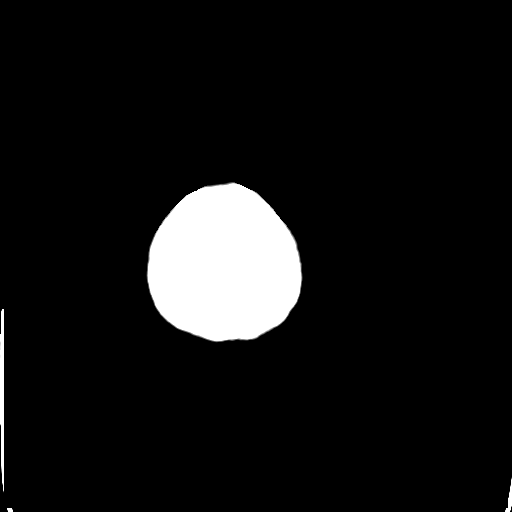

[Series 4: coronal soft tissue · coronal · 0.31mm/px · 3 of 62 slices shown]
[im 21/62  brain]
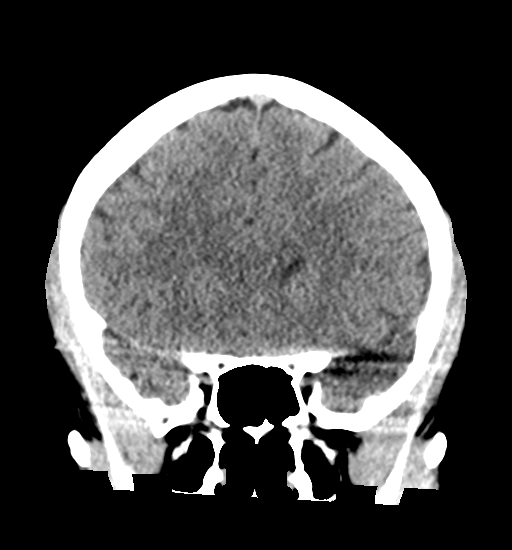
[im 28/62  brain]
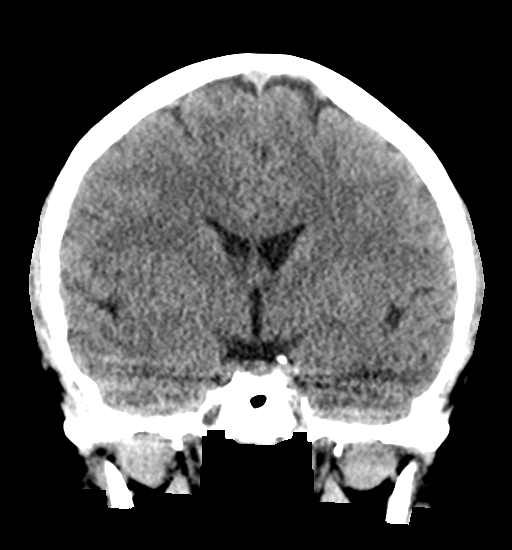
[im 34/62  brain]
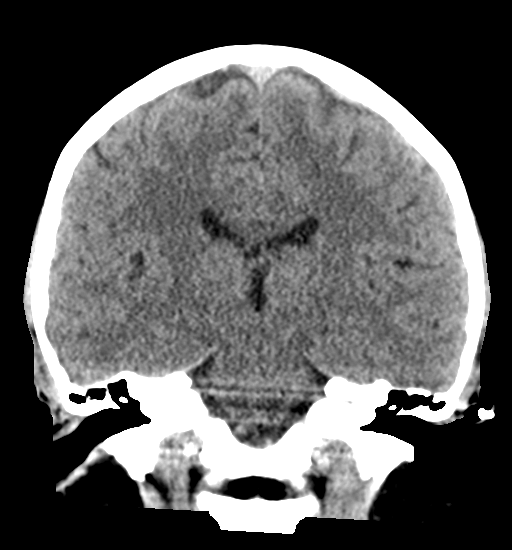

[Series 5: sagittal soft tissue · sagittal · 0.31mm/px · 3 of 54 slices shown]
[im 18/54  brain]
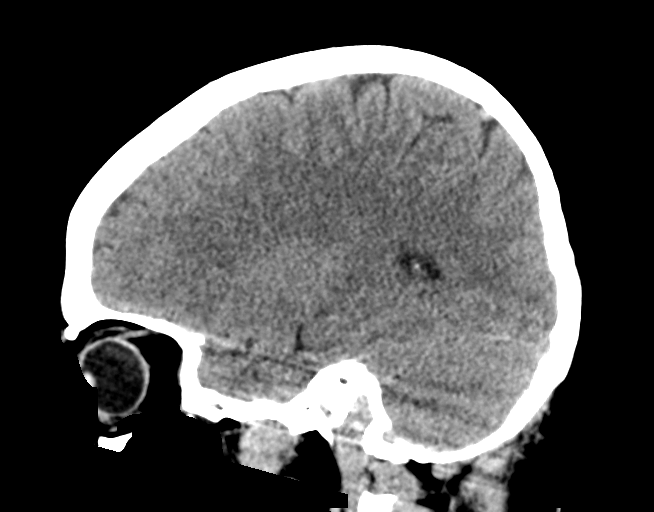
[im 27/54  brain]
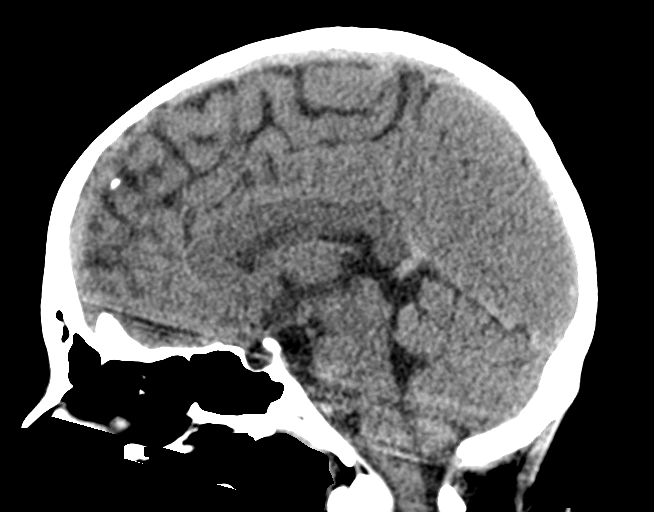
[im 36/54  brain]
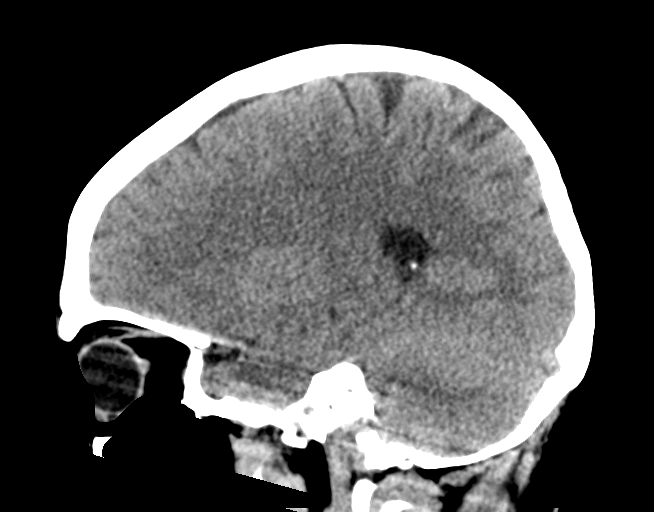

[16 of 47 positions shown; findings below may reference images not displayed]

FINDINGS: Brain: No evidence of acute infarction, hemorrhage, hydrocephalus,
extra-axial collection or mass lesion/mass effect.

Vascular: No hyperdense vessel or unexpected calcification.

Skull: Normal. Negative for fracture or focal lesion.

Sinuses/Orbits: Normal globes and orbits.  Clear sinuses.

Other: None peer
IMPRESSION: Normal unenhanced CT scan of the brain.

## 2021-11-30 ENCOUNTER — Other Ambulatory Visit: Payer: Self-pay

## 2021-11-30 MED ORDER — LEVOFLOXACIN 500 MG PO TABS
ORAL_TABLET | ORAL | 0 refills | Status: DC
  Filled 2021-11-30: qty 14, 14d supply, fill #0

## 2022-06-03 ENCOUNTER — Other Ambulatory Visit: Payer: Self-pay

## 2022-06-03 ENCOUNTER — Other Ambulatory Visit: Payer: Self-pay | Admitting: General Surgery

## 2022-06-03 MED ORDER — ONDANSETRON 4 MG PO TBDP
4.0000 mg | ORAL_TABLET | Freq: Three times a day (TID) | ORAL | 0 refills | Status: DC | PRN
  Filled 2022-06-03: qty 10, 4d supply, fill #0

## 2022-06-14 ENCOUNTER — Other Ambulatory Visit: Payer: Self-pay

## 2022-12-23 ENCOUNTER — Other Ambulatory Visit: Payer: Self-pay

## 2022-12-23 DIAGNOSIS — B9689 Other specified bacterial agents as the cause of diseases classified elsewhere: Secondary | ICD-10-CM | POA: Diagnosis not present

## 2022-12-23 DIAGNOSIS — N760 Acute vaginitis: Secondary | ICD-10-CM | POA: Diagnosis not present

## 2022-12-23 DIAGNOSIS — N761 Subacute and chronic vaginitis: Secondary | ICD-10-CM | POA: Diagnosis not present

## 2022-12-23 MED ORDER — METRONIDAZOLE 500 MG PO TABS
500.0000 mg | ORAL_TABLET | Freq: Two times a day (BID) | ORAL | 0 refills | Status: DC
  Filled 2022-12-23: qty 14, 7d supply, fill #0

## 2022-12-23 MED ORDER — METRONIDAZOLE 0.75 % VA GEL
1.0000 | VAGINAL | 5 refills | Status: DC
  Filled 2022-12-23: qty 70, 24d supply, fill #0

## 2022-12-23 MED ORDER — FLUCONAZOLE 150 MG PO TABS
150.0000 mg | ORAL_TABLET | Freq: Once | ORAL | 0 refills | Status: AC
  Filled 2022-12-23: qty 1, 1d supply, fill #0

## 2023-01-05 ENCOUNTER — Other Ambulatory Visit: Payer: Self-pay

## 2023-01-05 DIAGNOSIS — F332 Major depressive disorder, recurrent severe without psychotic features: Secondary | ICD-10-CM | POA: Diagnosis not present

## 2023-01-05 DIAGNOSIS — F419 Anxiety disorder, unspecified: Secondary | ICD-10-CM | POA: Diagnosis not present

## 2023-01-05 MED ORDER — FLUOXETINE HCL 20 MG PO CAPS
20.0000 mg | ORAL_CAPSULE | Freq: Every day | ORAL | 0 refills | Status: DC
  Filled 2023-01-05: qty 90, 90d supply, fill #0

## 2023-02-04 ENCOUNTER — Telehealth: Payer: 59 | Admitting: Nurse Practitioner

## 2023-02-04 DIAGNOSIS — R399 Unspecified symptoms and signs involving the genitourinary system: Secondary | ICD-10-CM | POA: Diagnosis not present

## 2023-02-04 DIAGNOSIS — B3731 Acute candidiasis of vulva and vagina: Secondary | ICD-10-CM | POA: Diagnosis not present

## 2023-02-05 ENCOUNTER — Other Ambulatory Visit: Payer: Self-pay

## 2023-02-05 MED ORDER — FLUCONAZOLE 150 MG PO TABS
ORAL_TABLET | ORAL | 0 refills | Status: DC
  Filled 2023-02-05: qty 2, 7d supply, fill #0

## 2023-02-05 MED ORDER — NITROFURANTOIN MONOHYD MACRO 100 MG PO CAPS
100.0000 mg | ORAL_CAPSULE | Freq: Two times a day (BID) | ORAL | 0 refills | Status: AC
Start: 2023-02-05 — End: 2023-02-22
  Filled 2023-02-05: qty 10, 5d supply, fill #0

## 2023-02-05 NOTE — Progress Notes (Signed)

## 2023-02-05 NOTE — Progress Notes (Signed)
I have spent 5 minutes in review of e-visit questionnaire, review and updating patient chart, medical decision making and response to patient.  ° °Jerrell Mangel W Secilia Apps, NP ° °  °

## 2023-03-31 ENCOUNTER — Other Ambulatory Visit: Payer: Self-pay

## 2023-03-31 MED ORDER — FLUOXETINE HCL 20 MG PO CAPS
20.0000 mg | ORAL_CAPSULE | Freq: Three times a day (TID) | ORAL | 0 refills | Status: DC
  Filled 2023-03-31: qty 90, 30d supply, fill #0

## 2023-07-10 ENCOUNTER — Other Ambulatory Visit: Payer: Self-pay

## 2023-07-10 MED ORDER — METRONIDAZOLE 500 MG PO TABS
500.0000 mg | ORAL_TABLET | Freq: Two times a day (BID) | ORAL | 0 refills | Status: DC
  Filled 2023-07-10: qty 14, 7d supply, fill #0

## 2023-07-10 MED ORDER — METRONIDAZOLE 0.75 % VA GEL
1.0000 | VAGINAL | 5 refills | Status: DC
  Filled 2023-07-10: qty 70, 3d supply, fill #0

## 2023-07-14 ENCOUNTER — Other Ambulatory Visit: Payer: Self-pay

## 2023-07-23 ENCOUNTER — Emergency Department (HOSPITAL_COMMUNITY)

## 2023-07-23 ENCOUNTER — Emergency Department (HOSPITAL_COMMUNITY)
Admission: EM | Admit: 2023-07-23 | Discharge: 2023-07-23 | Disposition: A | Discharge status: Home / Self Care | Attending: Emergency Medicine | Admitting: Emergency Medicine

## 2023-07-23 ENCOUNTER — Other Ambulatory Visit: Payer: Self-pay

## 2023-07-23 ENCOUNTER — Encounter (HOSPITAL_COMMUNITY): Payer: Self-pay | Admitting: Emergency Medicine

## 2023-07-23 DIAGNOSIS — S52124A Nondisplaced fracture of head of right radius, initial encounter for closed fracture: Secondary | ICD-10-CM | POA: Diagnosis not present

## 2023-07-23 DIAGNOSIS — Z91040 Latex allergy status: Secondary | ICD-10-CM | POA: Diagnosis not present

## 2023-07-23 DIAGNOSIS — M533 Sacrococcygeal disorders, not elsewhere classified: Secondary | ICD-10-CM | POA: Diagnosis not present

## 2023-07-23 DIAGNOSIS — M25421 Effusion, right elbow: Secondary | ICD-10-CM | POA: Diagnosis not present

## 2023-07-23 DIAGNOSIS — Y9351 Activity, roller skating (inline) and skateboarding: Secondary | ICD-10-CM | POA: Diagnosis not present

## 2023-07-23 DIAGNOSIS — M25521 Pain in right elbow: Secondary | ICD-10-CM | POA: Diagnosis not present

## 2023-07-23 DIAGNOSIS — S52121A Displaced fracture of head of right radius, initial encounter for closed fracture: Secondary | ICD-10-CM | POA: Diagnosis not present

## 2023-07-23 DIAGNOSIS — S6991XA Unspecified injury of right wrist, hand and finger(s), initial encounter: Secondary | ICD-10-CM | POA: Diagnosis present

## 2023-07-23 MED ORDER — HYDROCODONE-ACETAMINOPHEN 5-325 MG PO TABS
1.0000 | ORAL_TABLET | Freq: Once | ORAL | Status: AC
  Administered 2023-07-23: 1 via ORAL
  Filled 2023-07-23: qty 1

## 2023-07-23 MED ORDER — HYDROCODONE-ACETAMINOPHEN 5-325 MG PO TABS
1.0000 | ORAL_TABLET | Freq: Four times a day (QID) | ORAL | 0 refills | Status: DC | PRN
  Filled 2023-07-23: qty 10, 3d supply, fill #0

## 2023-07-23 NOTE — Discharge Instructions (Addendum)
 Take 1000mg  of tylneol every 6 hours. Take ibuprofen  every 8 hours. Take norco for breakthrough pain. Wear sling. Use ice. Follow up with orthopedics.

## 2023-07-23 NOTE — ED Triage Notes (Signed)
 Pt states she was roller skating with her family when she fell. Pt reports landing on her sacrum and c/o sacral pain when sitting (chose to stand during entire triage) and states she also placed both hands out during fall and felt a pop in her R elbow. States pain at rest but very pain when she try to extend her arm.

## 2023-07-23 NOTE — ED Provider Notes (Signed)
 [place] EMERGENCY DEPARTMENT AT [facility] Provider Note   CSN: [id number] Arrival date & time: 07/23/23  2028     History  Chief Complaint  Patient presents with   Fall   Elbow Injury    Jordan Wallace is a 28 y.o. female.  Without significant past medical history is reporting to emergency room after falling.  She reports that she was rollerskating when she lost her balance falling backwards landing on her bottom and on her right wrist with outstretched hand and wrist.  She reports that she is found she has had pain when sitting or going about a she is able to ambulate.  She did not hit her head had no loss of consciousness.  She is on a blood thinner.  She primarily reports right elbow pain and has pain with supination pronation.  She has bracing appointment suggested. Has not tried anything for symptoms.    Fall       Home Medications Prior to Admission medications   Medication Sig Start Date End Date Taking? Authorizing Provider  ondansetron  (ZOFRAN -ODT) 4 MG disintegrating tablet Take 1 tablet (4 mg total) by mouth every 8 (eight) hours as needed for nausea or vomiting. 06/03/22   Eldred Grego, MD  fluconazole  (DIFLUCAN ) 150 MG tablet Take 1 tablet (150 mg total) by mouth once for 1 dose. May repeat tablet in 1 week 02/05/23   Fleming, Zelda W, NP  FLUoxetine  (PROZAC ) 20 MG capsule Take 1 capsule (20 mg total) by mouth daily. 01/05/23     FLUoxetine  (PROZAC ) 20 MG capsule Take 1 capsule (20 mg total) by mouth 3 (three) times daily. 03/31/23     fluticasone  (GNP FLUTICASONE  PROPIONATE) 50 MCG/ACT nasal spray 2 sprays in each nostril QD 07/05/21     metroNIDAZOLE  (FLAGYL ) 500 MG tablet Take 1 tablet (500 mg total) by mouth 2 (two) times daily for 7 days. 12/23/22     metroNIDAZOLE  (METROGEL ) 0.75 % vaginal gel Place 1 applicator vaginally twice a week 04/12/21     paragard  intrauterine copper  IUD IUD 1 Intra Uterine Device (1 each total) by Intrauterine route  once for 1 dose. 05/22/20 05/22/20  Kris Pester, MD  trimethoprim -polymyxin b  (POLYTRIM ) ophthalmic solution Place 1 drop into the right eye every 4 (four) hours. 10/31/21   Collins Dean, NP      Allergies    Latex    Review of Systems   Review of Systems  Musculoskeletal:  Positive for arthralgias.    Physical Exam Updated Vital Signs BP 113/66 (BP Location: Left Arm)   Pulse 80   Temp 99.5 F (37.5 C) (Oral)   Resp 18   Ht 5\' 3"  (1.6 m)   Wt 74.8 kg   SpO2 99%   BMI 29.23 kg/m  Physical Exam Vitals and nursing note reviewed.  Constitutional:      General: She is not in acute distress.    Appearance: She is not toxic-appearing.  HENT:     Head: Normocephalic and atraumatic.  Eyes:     General: No scleral icterus.    Conjunctiva/sclera: Conjunctivae normal.  Cardiovascular:     Rate and Rhythm: Normal rate and regular rhythm.     Pulses: Normal pulses.     Heart sounds: Normal heart sounds.  Pulmonary:     Effort: Pulmonary effort is normal. No respiratory distress.     Breath sounds: Normal breath sounds.  Abdominal:     General: Abdomen is flat. Bowel sounds  are normal.     Palpations: Abdomen is soft.     Tenderness: There is no abdominal tenderness.  Musculoskeletal:     Comments: TTP over lateral elbow. Unable to flex and ext due to pain. Pain with supination and pronation. Strong radial pulse, soft compartments.   Skin:    General: Skin is warm and dry.     Findings: No lesion.  Neurological:     General: No focal deficit present.     Mental Status: She is alert and oriented to person, place, and time. Mental status is at baseline.     ED Results / Procedures / Treatments   Labs (all labs ordered are listed, but only abnormal results are displayed) Labs Reviewed - No data to display  EKG None  Radiology DG Sacrum/Coccyx Result Date: 07/23/2023 CLINICAL DATA:  Jordan Wallace, pain EXAM: SACRUM AND COCCYX - 2+ VIEW COMPARISON:  None Available.  FINDINGS: Pelvic inlet, pelvic outlet, and lateral views of the sacrum and coccyx are obtained. There are no acute displaced fractures. Sacroiliac joints appear unremarkable. Remainder of the visualized bony pelvis is normal. IUD identified. IMPRESSION: 1. Unremarkable sacrum and coccyx. Electronically Signed   By: Bobbye Burrow M.D.   On: 07/23/2023 22:02   DG Forearm Right Result Date: 07/23/2023 CLINICAL DATA:  Jordan Wallace EXAM: RIGHT FOREARM - 2 VIEW COMPARISON:  None Available. FINDINGS: Frontal and lateral views of the right forearm are obtained. Minimally displaced intra-articular radial head fracture is better visualized on corresponding right elbow exam. No other acute bony abnormalities. Alignment of the elbow and wrist is anatomic. Moderate right elbow effusion with elevation of the anterior fat pad. IMPRESSION: 1. Intra-articular radial head fracture better visualized on corresponding elbow series. 2. Right elbow effusion. Electronically Signed   By: Bobbye Burrow M.D.   On: 07/23/2023 22:01   DG Elbow Complete Right Result Date: 07/23/2023 CLINICAL DATA:  Jordan Wallace, pain EXAM: RIGHT ELBOW - COMPLETE 3+ VIEW COMPARISON:  None Available. FINDINGS: Frontal, oblique, and lateral views of the right elbow are obtained on 3 images. There is a minimally displaced intra-articular right radial head fracture, with near anatomic alignment. Joint spaces are well preserved. Moderate right elbow effusion, with prominent elevation of the anterior fat pad. IMPRESSION: 1. Minimally displaced intra-articular radial head fracture, with associated joint effusion. Electronically Signed   By: Bobbye Burrow M.D.   On: 07/23/2023 22:00    Procedures Procedures    Medications Ordered in ED Medications  HYDROcodone -acetaminophen  (NORCO/VICODIN) 5-325 MG per tablet 1 tablet (1 tablet Oral Given 07/23/23 2151)    ED Course/ Medical Decision Making/ A&P                                 Medical Decision Making Amount  and/or Complexity of Data Reviewed Radiology: ordered.  Risk Prescription drug management.   This patient presents to the ED for concern of elbow pain, this involves an extensive number of treatment options, and is a complaint that carries with it a high risk of complications and morbidity.  The differential diagnosis includes gout, pseudogout, fracture, dislocation    Imaging Studies ordered:  I ordered imaging studies including sacrum, forearm and elbow I independently visualized and interpreted imaging which showed mildly displaced radial head fracture I agree with the radiologist interpretation   Problem List / ED Course / Critical interventions / Medication management  1.  To emergency room after fall.  She  did not hit her head and loss of consciousness as she did not feel better.  Primarily reporting pain over her right elbow and sacrum.  She is neurovascularly intact.  Holding her right arm against her side.  Her grip strength is strong and she strong radial pulse.  She also reports pain with sitting.  She reproducible tenderness over her sacrum.  No saddle anesthesia or red flag symptoms associated with her back pain.  She has lower extremity strength and sensation equal bilaterally.  Will symptom management and get x-rays to rule out acute fracture. Patient is feeling better after pain control.  X-ray showing small radial head fracture.  Placed in splint and will follow-up with orthopedics.  I have reviewed the patients home medicines and have made adjustments as needed   Plan  F/u w/ PCP in 2-3d to ensure resolution of sx.  Patient was given return precautions. Patient stable for discharge at this time.  Patient educated on sx/dx and verbalized understanding of plan. Return to ER w/ new or worsening sx.          Final Clinical Impression(s) / ED Diagnoses Final diagnoses:  Closed nondisplaced fracture of head of right radius, initial encounter    Rx / DC  Orders ED Discharge Orders          Ordered    HYDROcodone -acetaminophen  (NORCO/VICODIN) 5-325 MG tablet  Every 6 hours PRN        07/23/23 2226              Kawehi Hostetter, Kandace Organ, PA-C 07/23/23 2333    Sueellen Emery, MD 07/24/23 [phone]

## 2023-07-24 ENCOUNTER — Other Ambulatory Visit: Payer: Self-pay

## 2023-07-24 DIAGNOSIS — S59801A Other specified injuries of right elbow, initial encounter: Secondary | ICD-10-CM | POA: Diagnosis not present

## 2023-07-24 NOTE — ED Notes (Signed)
 Pt called wanting prescription changed to another pharmacy but then decided to just pick it up tomorrow.

## 2023-07-25 ENCOUNTER — Other Ambulatory Visit: Payer: Self-pay

## 2023-07-25 DIAGNOSIS — S52124A Nondisplaced fracture of head of right radius, initial encounter for closed fracture: Secondary | ICD-10-CM | POA: Diagnosis not present

## 2023-07-25 MED ORDER — TRAMADOL HCL 50 MG PO TABS
50.0000 mg | ORAL_TABLET | Freq: Three times a day (TID) | ORAL | 0 refills | Status: DC | PRN
  Filled 2023-07-25: qty 21, 7d supply, fill #0

## 2024-03-02 ENCOUNTER — Telehealth: Admitting: Nurse Practitioner

## 2024-03-02 DIAGNOSIS — R309 Painful micturition, unspecified: Secondary | ICD-10-CM

## 2024-03-02 MED ORDER — NITROFURANTOIN MONOHYD MACRO 100 MG PO CAPS
100.0000 mg | ORAL_CAPSULE | Freq: Two times a day (BID) | ORAL | 0 refills | Status: AC
  Filled 2024-03-02: qty 10, 5d supply, fill #0

## 2024-03-02 NOTE — Progress Notes (Signed)

## 2024-03-03 ENCOUNTER — Other Ambulatory Visit: Payer: Self-pay

## 2024-03-14 ENCOUNTER — Telehealth: Admitting: Physician Assistant

## 2024-03-14 DIAGNOSIS — N760 Acute vaginitis: Secondary | ICD-10-CM | POA: Diagnosis not present

## 2024-03-14 DIAGNOSIS — B9689 Other specified bacterial agents as the cause of diseases classified elsewhere: Secondary | ICD-10-CM | POA: Diagnosis not present

## 2024-03-14 MED ORDER — METRONIDAZOLE 500 MG PO TABS: 500.0000 mg | ORAL_TABLET | Freq: Two times a day (BID) | ORAL | 0 refills | Status: AC

## 2024-03-14 MED ORDER — FLUCONAZOLE 150 MG PO TABS: ORAL_TABLET | ORAL | 0 refills | Status: AC

## 2024-03-14 NOTE — Progress Notes (Signed)
 We are sorry that you are not feeling well. Here is how we plan to help! Based on what you shared with me it looks like you: May have a vaginosis due to bacteria  Vaginosis is an inflammation of the vagina that can result in discharge, itching and pain. The cause is usually a change in the normal balance of vaginal bacteria or an infection. Vaginosis can also result from reduced estrogen levels after menopause.  The most common causes of vaginosis are:   Bacterial vaginosis which results from an overgrowth of one on several organisms that are normally present in your vagina.   Yeast infections which are caused by a naturally occurring fungus called candida.   Vaginal atrophy (atrophic vaginosis) which results from the thinning of the vagina from reduced estrogen levels after menopause.   Trichomoniasis which is caused by a parasite and is commonly transmitted by sexual intercourse.  Factors that increase your risk of developing vaginosis include: Medications, such as antibiotics and steroids Uncontrolled diabetes Use of hygiene products such as bubble bath, vaginal spray or vaginal deodorant Douching Wearing damp or tight-fitting clothing Using an intrauterine device (IUD) for birth control Hormonal changes, such as those associated with pregnancy, birth control pills or menopause Sexual activity Having a sexually transmitted infection  Your treatment plan is Metronidazole  or Flagyl  500mg  twice a day for 7 days.  I have electronically sent this prescription into the pharmacy that you have chosen. I have also sent in Diflucan  in case of yeast from antibiotic use.   Be sure to take all of the medication as directed. Stop taking any medication if you develop a rash, tongue swelling or shortness of breath. Mothers who are breast feeding should consider pumping and discarding their breast milk while on these antibiotics. However, there is no consensus that infant exposure at these doses would  be harmful.  Remember that medication creams can weaken latex condoms.   HOME CARE:  Good hygiene may prevent some types of vaginosis from recurring and may relieve some symptoms:  Avoid baths, hot tubs and whirlpool spas. Rinse soap from your outer genital area after a shower, and dry the area well to prevent irritation. Don't use scented or harsh soaps, such as those with deodorant or antibacterial action. Avoid irritants. These include scented tampons and pads. Wipe from front to back after using the toilet. Doing so avoids spreading fecal bacteria to your vagina.  Other things that may help prevent vaginosis include:  Don't douche. Your vagina doesn't require cleansing other than normal bathing. Repetitive douching disrupts the normal organisms that reside in the vagina and can actually increase your risk of vaginal infection. Douching won't clear up a vaginal infection. Use a latex condom. Both female and female latex condoms may help you avoid infections spread by sexual contact. Wear cotton underwear. Also wear pantyhose with a cotton crotch. If you feel comfortable without it, skip wearing underwear to bed. Yeast thrives in [facility] Your symptoms should improve in the next day or two.  GET HELP RIGHT AWAY IF:  You have pain in your lower abdomen ( pelvic area or over your ovaries) You develop nausea or vomiting You develop a fever Your discharge changes or worsens You have persistent pain with intercourse You develop shortness of breath, a rapid pulse, or you faint.  These symptoms could be signs of problems or infections that need to be evaluated by a medical provider now.  MAKE SURE YOU   Understand these instructions.  Will watch your condition. Will get help right away if you are not doing well or get worse.  Your e-visit answers were reviewed by a board certified advanced clinical practitioner to complete your personal care plan. Depending upon the condition,  your plan could have included both over the counter or prescription medications. Please review your pharmacy choice to make sure that you have choses a pharmacy that is open for you to pick up any needed prescription, Your safety is important to us . If you have drug allergies check your prescription carefully.   You can use MyChart to ask questions about todays visit, request a non-urgent call back, or ask for a work or school excuse for 24 hours related to this e-Visit. If it has been greater than 24 hours you will need to follow up with your provider, or enter a new e-Visit to address those concerns. You will get a MyChart message within the next two days asking about your experience. I hope that your e-visit has been valuable and will speed your recovery.  I have spent 5 minutes in review of e-visit questionnaire, review and updating patient chart, medical decision making and response to patient.   Elsie Velma Lunger, PA-C

## 2024-04-16 ENCOUNTER — Telehealth: Admitting: Emergency Medicine

## 2024-04-16 DIAGNOSIS — N898 Other specified noninflammatory disorders of vagina: Secondary | ICD-10-CM

## 2024-04-16 DIAGNOSIS — R300 Dysuria: Secondary | ICD-10-CM

## 2024-04-16 NOTE — Progress Notes (Signed)
" °  Because you were treated last month for a urine infection and for bacterial vaginosis, I feel your condition warrants further evaluation with urine and vaginal testing and I recommend that you be seen in a face-to-face visit.   NOTE: There will be NO CHARGE for this E-Visit   If you are having a true medical emergency, please call 911.     For an urgent face to face visit, [facility] has multiple urgent care centers for your convenience.  Click the link below for the full list of locations and hours, walk-in wait times, appointment scheduling options and driving directions:  Urgent Care - [place], [place], [place], [place], [place], [place]  [place]     Your MyChart E-visit questionnaire answers were reviewed by a board certified advanced clinical practitioner to complete your personal care plan based on your specific symptoms.    Thank you for using e-Visits.    "
